# Patient Record
Sex: Female | Born: 1975 | Race: Asian | Hispanic: No | Marital: Married | Smoking: Never smoker
Health system: Southern US, Community
[De-identification: ages and names within clinical notes are randomized; demographics above are authoritative.]

## PROBLEM LIST (undated history)

## (undated) DIAGNOSIS — Z789 Other specified health status: Secondary | ICD-10-CM

## (undated) DIAGNOSIS — F32A Depression, unspecified: Secondary | ICD-10-CM

## (undated) DIAGNOSIS — K743 Primary biliary cirrhosis: Secondary | ICD-10-CM

## (undated) DIAGNOSIS — D696 Thrombocytopenia, unspecified: Secondary | ICD-10-CM

## (undated) DIAGNOSIS — IMO0001 Reserved for inherently not codable concepts without codable children: Secondary | ICD-10-CM

## (undated) DIAGNOSIS — R0600 Dyspnea, unspecified: Secondary | ICD-10-CM

## (undated) DIAGNOSIS — K627 Radiation proctitis: Secondary | ICD-10-CM

## (undated) DIAGNOSIS — F329 Major depressive disorder, single episode, unspecified: Secondary | ICD-10-CM

## (undated) DIAGNOSIS — K7469 Other cirrhosis of liver: Secondary | ICD-10-CM

## (undated) HISTORY — PX: TUBAL LIGATION: SHX77

## (undated) HISTORY — PX: DILATION AND CURETTAGE OF UTERUS: SHX78

---
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
## 1997-12-19 DIAGNOSIS — K831 Obstruction of bile duct: Secondary | ICD-10-CM

## 1997-12-19 HISTORY — DX: Obstruction of bile duct: K83.1

## 2018-12-19 DIAGNOSIS — C55 Malignant neoplasm of uterus, part unspecified: Secondary | ICD-10-CM

## 2018-12-19 HISTORY — DX: Malignant neoplasm of uterus, part unspecified: C55

## 2019-09-24 DIAGNOSIS — I959 Hypotension, unspecified: Secondary | ICD-10-CM

## 2019-09-24 DIAGNOSIS — D696 Thrombocytopenia, unspecified: Secondary | ICD-10-CM

## 2019-09-24 DIAGNOSIS — Z6821 Body mass index (BMI) 21.0-21.9, adult: Secondary | ICD-10-CM

## 2019-09-24 DIAGNOSIS — D509 Iron deficiency anemia, unspecified: Secondary | ICD-10-CM

## 2019-09-24 DIAGNOSIS — D61818 Other pancytopenia: Secondary | ICD-10-CM

## 2019-09-24 DIAGNOSIS — D649 Anemia, unspecified: Secondary | ICD-10-CM

## 2019-09-24 DIAGNOSIS — R10819 Abdominal tenderness, unspecified site: Secondary | ICD-10-CM

## 2019-09-24 DIAGNOSIS — K7460 Unspecified cirrhosis of liver: Secondary | ICD-10-CM

## 2019-09-24 DIAGNOSIS — D689 Coagulation defect, unspecified: Secondary | ICD-10-CM

## 2019-09-24 DIAGNOSIS — R188 Other ascites: Secondary | ICD-10-CM

## 2019-09-24 DIAGNOSIS — Z597 Insufficient social insurance and welfare support: Secondary | ICD-10-CM

## 2019-09-24 DIAGNOSIS — Z8719 Personal history of other diseases of the digestive system: Secondary | ICD-10-CM

## 2019-09-24 DIAGNOSIS — E46 Unspecified protein-calorie malnutrition: Secondary | ICD-10-CM

## 2019-09-24 DIAGNOSIS — R109 Unspecified abdominal pain: Secondary | ICD-10-CM

## 2019-09-24 DIAGNOSIS — K743 Primary biliary cirrhosis: Secondary | ICD-10-CM

## 2019-09-24 DIAGNOSIS — Z20828 Contact with and (suspected) exposure to other viral communicable diseases: Secondary | ICD-10-CM

## 2019-09-24 DIAGNOSIS — R140 Abdominal distension (gaseous): Secondary | ICD-10-CM

## 2019-09-24 DIAGNOSIS — K429 Umbilical hernia without obstruction or gangrene: Secondary | ICD-10-CM

## 2019-09-24 DIAGNOSIS — R197 Diarrhea, unspecified: Secondary | ICD-10-CM

## 2019-09-24 DIAGNOSIS — K7290 Hepatic failure, unspecified without coma: Secondary | ICD-10-CM

## 2019-09-25 DIAGNOSIS — D689 Coagulation defect, unspecified: Secondary | ICD-10-CM

## 2019-09-25 DIAGNOSIS — D696 Thrombocytopenia, unspecified: Secondary | ICD-10-CM

## 2019-09-25 DIAGNOSIS — R188 Other ascites: Secondary | ICD-10-CM

## 2019-09-25 DIAGNOSIS — I959 Hypotension, unspecified: Secondary | ICD-10-CM

## 2019-09-25 DIAGNOSIS — K7460 Unspecified cirrhosis of liver: Secondary | ICD-10-CM

## 2019-09-25 DIAGNOSIS — R140 Abdominal distension (gaseous): Secondary | ICD-10-CM

## 2019-09-25 DIAGNOSIS — Z597 Insufficient social insurance and welfare support: Secondary | ICD-10-CM

## 2019-09-25 DIAGNOSIS — D61818 Other pancytopenia: Secondary | ICD-10-CM

## 2019-09-25 DIAGNOSIS — Z6821 Body mass index (BMI) 21.0-21.9, adult: Secondary | ICD-10-CM

## 2019-09-25 DIAGNOSIS — E46 Unspecified protein-calorie malnutrition: Secondary | ICD-10-CM

## 2019-09-25 DIAGNOSIS — R197 Diarrhea, unspecified: Secondary | ICD-10-CM

## 2019-09-25 DIAGNOSIS — Z20828 Contact with and (suspected) exposure to other viral communicable diseases: Secondary | ICD-10-CM

## 2019-09-25 DIAGNOSIS — R10819 Abdominal tenderness, unspecified site: Secondary | ICD-10-CM

## 2019-09-25 DIAGNOSIS — K7290 Hepatic failure, unspecified without coma: Secondary | ICD-10-CM

## 2019-09-25 DIAGNOSIS — K429 Umbilical hernia without obstruction or gangrene: Secondary | ICD-10-CM

## 2019-09-25 DIAGNOSIS — D509 Iron deficiency anemia, unspecified: Secondary | ICD-10-CM

## 2019-09-26 DIAGNOSIS — Z20828 Contact with and (suspected) exposure to other viral communicable diseases: Secondary | ICD-10-CM

## 2019-09-26 DIAGNOSIS — D696 Thrombocytopenia, unspecified: Secondary | ICD-10-CM

## 2019-09-26 DIAGNOSIS — R197 Diarrhea, unspecified: Secondary | ICD-10-CM

## 2019-09-26 DIAGNOSIS — I959 Hypotension, unspecified: Secondary | ICD-10-CM

## 2019-09-26 DIAGNOSIS — D509 Iron deficiency anemia, unspecified: Secondary | ICD-10-CM

## 2019-09-26 DIAGNOSIS — R140 Abdominal distension (gaseous): Secondary | ICD-10-CM

## 2019-09-26 DIAGNOSIS — K7290 Hepatic failure, unspecified without coma: Secondary | ICD-10-CM

## 2019-09-26 DIAGNOSIS — R10819 Abdominal tenderness, unspecified site: Secondary | ICD-10-CM

## 2019-09-26 DIAGNOSIS — D61818 Other pancytopenia: Secondary | ICD-10-CM

## 2019-09-26 DIAGNOSIS — Z6821 Body mass index (BMI) 21.0-21.9, adult: Secondary | ICD-10-CM

## 2019-09-26 DIAGNOSIS — R188 Other ascites: Secondary | ICD-10-CM

## 2019-09-26 DIAGNOSIS — K7460 Unspecified cirrhosis of liver: Secondary | ICD-10-CM

## 2019-09-26 DIAGNOSIS — K429 Umbilical hernia without obstruction or gangrene: Secondary | ICD-10-CM

## 2019-09-26 DIAGNOSIS — Z597 Insufficient social insurance and welfare support: Secondary | ICD-10-CM

## 2019-09-26 DIAGNOSIS — D689 Coagulation defect, unspecified: Secondary | ICD-10-CM

## 2019-09-26 DIAGNOSIS — E46 Unspecified protein-calorie malnutrition: Secondary | ICD-10-CM

## 2019-09-28 MED ORDER — SPIRONOLACTONE 25 MG TABLET
ORAL_TABLET | Freq: Every day | ORAL | 0 refills | 30.00000 days | Status: CP
Start: 2019-09-28 — End: 2019-10-28

## 2019-09-28 MED ORDER — FERROUS SULFATE 325 MG (65 MG IRON) TABLET
ORAL_TABLET | Freq: Two times a day (BID) | ORAL | 3 refills | 90.00000 days | Status: CP
Start: 2019-09-28 — End: ?

## 2019-09-28 MED ORDER — PANTOPRAZOLE 40 MG TABLET,DELAYED RELEASE
ORAL_TABLET | Freq: Every day | ORAL | 0 refills | 90.00000 days | Status: CP
Start: 2019-09-28 — End: ?

## 2019-09-28 MED ORDER — CIPROFLOXACIN 500 MG TABLET
ORAL_TABLET | ORAL | 0 refills | 29.00000 days | Status: CP
Start: 2019-09-28 — End: ?

## 2019-09-28 MED ORDER — ONDANSETRON 4 MG DISINTEGRATING TABLET
ORAL_TABLET | Freq: Three times a day (TID) | ORAL | 0 refills | 4.00000 days | Status: CP | PRN
Start: 2019-09-28 — End: 2019-10-05

## 2019-09-28 MED ORDER — FUROSEMIDE 40 MG TABLET
ORAL_TABLET | Freq: Every day | ORAL | 3 refills | 90.00000 days | Status: CP
Start: 2019-09-28 — End: 2019-10-28

## 2019-09-28 MED ORDER — MIDODRINE 5 MG TABLET
ORAL_TABLET | Freq: Three times a day (TID) | ORAL | 11 refills | 30.00000 days | Status: CP
Start: 2019-09-28 — End: ?

## 2019-09-28 MED ORDER — URSODIOL 300 MG CAPSULE
ORAL_CAPSULE | Freq: Two times a day (BID) | ORAL | 11 refills | 30.00000 days | Status: CP
Start: 2019-09-28 — End: 2019-10-28

## 2019-09-28 MED FILL — URSODIOL 300 MG CAPSULE: 30 days supply | Qty: 60 | Fill #0 | Status: AC

## 2019-09-28 MED FILL — SPIRONOLACTONE 25 MG TABLET: 30 days supply | Qty: 60 | Fill #0 | Status: AC

## 2019-09-28 MED FILL — CIPROFLOXACIN 500 MG TABLET: 29 days supply | Qty: 29 | Fill #0 | Status: AC

## 2019-09-28 MED FILL — ONDANSETRON 4 MG DISINTEGRATING TABLET: 4 days supply | Qty: 10 | Fill #0 | Status: AC

## 2019-09-28 MED FILL — MIDODRINE 5 MG TABLET: 30 days supply | Qty: 270 | Fill #0 | Status: AC

## 2019-09-28 MED FILL — PANTOPRAZOLE 40 MG TABLET,DELAYED RELEASE: 30 days supply | Qty: 30 | Fill #0 | Status: AC

## 2019-09-28 MED FILL — FUROSEMIDE 40 MG TABLET: 30 days supply | Qty: 30 | Fill #0 | Status: AC

## 2019-09-28 MED FILL — FERROUS SULFATE 325 MG (65 MG IRON) TABLET: 30 days supply | Qty: 60 | Fill #0 | Status: AC

## 2019-09-29 DIAGNOSIS — R4182 Altered mental status, unspecified: Secondary | ICD-10-CM

## 2019-09-29 DIAGNOSIS — K7290 Hepatic failure, unspecified without coma: Secondary | ICD-10-CM

## 2019-10-01 MED ORDER — LACTULOSE 10 GRAM/15 ML ORAL SOLUTION: 20 g | mL | Freq: Two times a day (BID) | 0 refills | 32 days | Status: AC

## 2020-01-09 ENCOUNTER — Emergency Department (HOSPITAL_COMMUNITY): Payer: Medicaid Other

## 2020-01-09 ENCOUNTER — Inpatient Hospital Stay (HOSPITAL_COMMUNITY)
Admission: EM | Admit: 2020-01-09 | Discharge: 2020-01-14 | DRG: 441 | Discharge status: Against Medical Advice | Payer: Medicaid Other | Attending: Internal Medicine | Admitting: Internal Medicine

## 2020-01-09 ENCOUNTER — Inpatient Hospital Stay (HOSPITAL_COMMUNITY): Payer: Medicaid Other

## 2020-01-09 ENCOUNTER — Other Ambulatory Visit: Payer: Self-pay

## 2020-01-09 ENCOUNTER — Encounter (HOSPITAL_COMMUNITY): Payer: Self-pay | Admitting: Emergency Medicine

## 2020-01-09 DIAGNOSIS — D638 Anemia in other chronic diseases classified elsewhere: Secondary | ICD-10-CM | POA: Diagnosis present

## 2020-01-09 DIAGNOSIS — Z5329 Procedure and treatment not carried out because of patient's decision for other reasons: Secondary | ICD-10-CM | POA: Diagnosis present

## 2020-01-09 DIAGNOSIS — D731 Hypersplenism: Secondary | ICD-10-CM | POA: Diagnosis present

## 2020-01-09 DIAGNOSIS — K743 Primary biliary cirrhosis: Secondary | ICD-10-CM | POA: Diagnosis present

## 2020-01-09 DIAGNOSIS — Z515 Encounter for palliative care: Secondary | ICD-10-CM

## 2020-01-09 DIAGNOSIS — Z923 Personal history of irradiation: Secondary | ICD-10-CM

## 2020-01-09 DIAGNOSIS — R64 Cachexia: Secondary | ICD-10-CM | POA: Diagnosis present

## 2020-01-09 DIAGNOSIS — Z681 Body mass index (BMI) 19 or less, adult: Secondary | ICD-10-CM

## 2020-01-09 DIAGNOSIS — Z7682 Awaiting organ transplant status: Secondary | ICD-10-CM

## 2020-01-09 DIAGNOSIS — J90 Pleural effusion, not elsewhere classified: Secondary | ICD-10-CM

## 2020-01-09 DIAGNOSIS — K766 Portal hypertension: Secondary | ICD-10-CM | POA: Diagnosis present

## 2020-01-09 DIAGNOSIS — J9601 Acute respiratory failure with hypoxia: Secondary | ICD-10-CM | POA: Diagnosis present

## 2020-01-09 DIAGNOSIS — Z7189 Other specified counseling: Secondary | ICD-10-CM

## 2020-01-09 DIAGNOSIS — Z20822 Contact with and (suspected) exposure to covid-19: Secondary | ICD-10-CM | POA: Diagnosis present

## 2020-01-09 DIAGNOSIS — R188 Other ascites: Secondary | ICD-10-CM

## 2020-01-09 DIAGNOSIS — E209 Hypoparathyroidism, unspecified: Secondary | ICD-10-CM | POA: Diagnosis present

## 2020-01-09 DIAGNOSIS — E43 Unspecified severe protein-calorie malnutrition: Secondary | ICD-10-CM | POA: Diagnosis present

## 2020-01-09 DIAGNOSIS — K7290 Hepatic failure, unspecified without coma: Secondary | ICD-10-CM | POA: Diagnosis present

## 2020-01-09 DIAGNOSIS — K7460 Unspecified cirrhosis of liver: Secondary | ICD-10-CM

## 2020-01-09 DIAGNOSIS — R0602 Shortness of breath: Secondary | ICD-10-CM | POA: Diagnosis present

## 2020-01-09 DIAGNOSIS — Z833 Family history of diabetes mellitus: Secondary | ICD-10-CM | POA: Diagnosis not present

## 2020-01-09 DIAGNOSIS — J948 Other specified pleural conditions: Secondary | ICD-10-CM

## 2020-01-09 DIAGNOSIS — Z9889 Other specified postprocedural states: Secondary | ICD-10-CM

## 2020-01-09 DIAGNOSIS — Z8709 Personal history of other diseases of the respiratory system: Secondary | ICD-10-CM

## 2020-01-09 DIAGNOSIS — F329 Major depressive disorder, single episode, unspecified: Secondary | ICD-10-CM | POA: Insufficient documentation

## 2020-01-09 DIAGNOSIS — F32A Depression, unspecified: Secondary | ICD-10-CM | POA: Insufficient documentation

## 2020-01-09 DIAGNOSIS — R7989 Other specified abnormal findings of blood chemistry: Secondary | ICD-10-CM

## 2020-01-09 DIAGNOSIS — Z885 Allergy status to narcotic agent status: Secondary | ICD-10-CM | POA: Diagnosis not present

## 2020-01-09 DIAGNOSIS — J918 Pleural effusion in other conditions classified elsewhere: Secondary | ICD-10-CM

## 2020-01-09 DIAGNOSIS — K7469 Other cirrhosis of liver: Secondary | ICD-10-CM

## 2020-01-09 DIAGNOSIS — Z531 Procedure and treatment not carried out because of patient's decision for reasons of belief and group pressure: Secondary | ICD-10-CM | POA: Diagnosis present

## 2020-01-09 DIAGNOSIS — K7210 Chronic hepatic failure without coma: Secondary | ICD-10-CM

## 2020-01-09 DIAGNOSIS — D696 Thrombocytopenia, unspecified: Secondary | ICD-10-CM | POA: Diagnosis present

## 2020-01-09 DIAGNOSIS — R0600 Dyspnea, unspecified: Secondary | ICD-10-CM

## 2020-01-09 DIAGNOSIS — Z8542 Personal history of malignant neoplasm of other parts of uterus: Secondary | ICD-10-CM

## 2020-01-09 DIAGNOSIS — R0902 Hypoxemia: Secondary | ICD-10-CM

## 2020-01-09 HISTORY — DX: Dyspnea, unspecified: R06.00

## 2020-01-09 HISTORY — DX: Other specified health status: Z78.9

## 2020-01-09 HISTORY — DX: Radiation proctitis: K62.7

## 2020-01-09 HISTORY — DX: Primary biliary cirrhosis: K74.3

## 2020-01-09 HISTORY — DX: Depression, unspecified: F32.A

## 2020-01-09 HISTORY — DX: Other cirrhosis of liver: K74.69

## 2020-01-09 HISTORY — DX: Thrombocytopenia, unspecified: D69.6

## 2020-01-09 HISTORY — PX: THORACENTESIS: SHX235

## 2020-01-09 HISTORY — DX: Reserved for inherently not codable concepts without codable children: IMO0001

## 2020-01-09 HISTORY — PX: PARACENTESIS: SHX844

## 2020-01-09 LAB — BODY FLUID CELL COUNT WITH DIFFERENTIAL
Eos, Fluid: 0 %
Eos, Fluid: 17 %
Lymphs, Fluid: 56 %
Lymphs, Fluid: 79 %
Monocyte-Macrophage-Serous Fluid: 11 % — ABNORMAL LOW (ref 50–90)
Monocyte-Macrophage-Serous Fluid: 20 % — ABNORMAL LOW (ref 50–90)
Neutrophil Count, Fluid: 10 % (ref 0–25)
Neutrophil Count, Fluid: 7 % (ref 0–25)
Other Cells, Fluid: 0 %
Other Cells, Fluid: 0 %
Total Nucleated Cell Count, Fluid: 26 cu mm (ref 0–1000)
Total Nucleated Cell Count, Fluid: 80 cu mm (ref 0–1000)

## 2020-01-09 LAB — CBC WITH DIFFERENTIAL/PLATELET
Abs Immature Granulocytes: 0.03 10*3/uL (ref 0.00–0.07)
Basophils Absolute: 0.1 10*3/uL (ref 0.0–0.1)
Basophils Relative: 1 %
Eosinophils Absolute: 0.5 10*3/uL (ref 0.0–0.5)
Eosinophils Relative: 8 %
HCT: 34.4 % — ABNORMAL LOW (ref 36.0–46.0)
Hemoglobin: 11 g/dL — ABNORMAL LOW (ref 12.0–15.0)
Immature Granulocytes: 1 %
Lymphocytes Relative: 10 %
Lymphs Abs: 0.5 10*3/uL — ABNORMAL LOW (ref 0.7–4.0)
MCH: 35.3 pg — ABNORMAL HIGH (ref 26.0–34.0)
MCHC: 32 g/dL (ref 30.0–36.0)
MCV: 110.3 fL — ABNORMAL HIGH (ref 80.0–100.0)
Monocytes Absolute: 0.5 10*3/uL (ref 0.1–1.0)
Monocytes Relative: 9 %
Neutro Abs: 3.8 10*3/uL (ref 1.7–7.7)
Neutrophils Relative %: 71 %
Platelets: 37 10*3/uL — ABNORMAL LOW (ref 150–400)
RBC: 3.12 MIL/uL — ABNORMAL LOW (ref 3.87–5.11)
RDW: 16.1 % — ABNORMAL HIGH (ref 11.5–15.5)
WBC: 5.3 10*3/uL (ref 4.0–10.5)
nRBC: 0 % (ref 0.0–0.2)

## 2020-01-09 LAB — GLUCOSE, PLEURAL OR PERITONEAL FLUID
Glucose, Fluid: 84 mg/dL
Glucose, Fluid: 84 mg/dL

## 2020-01-09 LAB — COMPREHENSIVE METABOLIC PANEL
ALT: 52 U/L — ABNORMAL HIGH (ref 0–44)
AST: 118 U/L — ABNORMAL HIGH (ref 15–41)
Albumin: 2.8 g/dL — ABNORMAL LOW (ref 3.5–5.0)
Alkaline Phosphatase: 225 U/L — ABNORMAL HIGH (ref 38–126)
Anion gap: 11 (ref 5–15)
BUN: 16 mg/dL (ref 6–20)
CO2: 20 mmol/L — ABNORMAL LOW (ref 22–32)
Calcium: 9 mg/dL (ref 8.9–10.3)
Chloride: 105 mmol/L (ref 98–111)
Creatinine, Ser: 0.99 mg/dL (ref 0.44–1.00)
GFR calc Af Amer: >60 mL/min (ref >60)
GFR calc non Af Amer: >60 mL/min (ref >60)
Glucose, Bld: 73 mg/dL (ref 70–99)
Potassium: 5.1 mmol/L (ref 3.5–5.1)
Sodium: 136 mmol/L (ref 135–145)
Total Bilirubin: 9.8 mg/dL — ABNORMAL HIGH (ref 0.3–1.2)
Total Protein: 5.4 g/dL — ABNORMAL LOW (ref 6.5–8.1)

## 2020-01-09 LAB — PROTEIN, PLEURAL OR PERITONEAL FLUID
Total protein, fluid: <3 g/dL
Total protein, fluid: <3 g/dL

## 2020-01-09 LAB — LACTATE DEHYDROGENASE, PLEURAL OR PERITONEAL FLUID: LD, Fluid: 31 U/L — ABNORMAL HIGH (ref 3–23)

## 2020-01-09 LAB — PROTIME-INR
INR: 1.3 — ABNORMAL HIGH (ref 0.8–1.2)
Prothrombin Time: 16 seconds — ABNORMAL HIGH (ref 11.4–15.2)

## 2020-01-09 LAB — ABO/RH: ABO/RH(D): O POS

## 2020-01-09 LAB — RESPIRATORY PANEL BY RT PCR (FLU A&B, COVID)
Influenza A by PCR: NEGATIVE
Influenza B by PCR: NEGATIVE
SARS Coronavirus 2 by RT PCR: NEGATIVE

## 2020-01-09 LAB — ALBUMIN, PLEURAL OR PERITONEAL FLUID: Albumin, Fluid: <1 g/dL

## 2020-01-09 LAB — TYPE AND SCREEN
ABO/RH(D): O POS
Antibody Screen: NEGATIVE

## 2020-01-09 MED ORDER — URSODIOL 300 MG PO CAPS
300.0000 mg | ORAL_CAPSULE | Freq: Two times a day (BID) | ORAL | Status: DC
  Administered 2020-01-09 – 2020-01-14 (×10): 300 mg via ORAL
  Filled 2020-01-09 (×12): qty 1

## 2020-01-09 MED ORDER — LACTULOSE 10 GM/15ML PO SOLN
20.0000 g | Freq: Three times a day (TID) | ORAL | Status: DC
  Administered 2020-01-09 – 2020-01-14 (×8): 20 g via ORAL
  Filled 2020-01-09 (×11): qty 30

## 2020-01-09 MED ORDER — SPIRONOLACTONE 25 MG PO TABS
100.0000 mg | ORAL_TABLET | Freq: Every day | ORAL | Status: DC
  Administered 2020-01-10: 100 mg via ORAL
  Filled 2020-01-09: qty 1
  Filled 2020-01-09: qty 4

## 2020-01-09 MED ORDER — PANTOPRAZOLE SODIUM 40 MG PO TBEC
40.0000 mg | DELAYED_RELEASE_TABLET | Freq: Every day | ORAL | Status: DC
  Administered 2020-01-09 – 2020-01-14 (×6): 40 mg via ORAL
  Filled 2020-01-09 (×6): qty 1

## 2020-01-09 MED ORDER — ALBUMIN HUMAN 25 % IV SOLN
25.0000 g | Freq: Once | INTRAVENOUS | Status: AC
  Administered 2020-01-09: 25 g via INTRAVENOUS
  Filled 2020-01-09 (×2): qty 100

## 2020-01-09 MED ORDER — SODIUM CHLORIDE 0.9% FLUSH
3.0000 mL | Freq: Two times a day (BID) | INTRAVENOUS | Status: DC
  Administered 2020-01-09: 10 mL via INTRAVENOUS
  Administered 2020-01-09 – 2020-01-14 (×10): 3 mL via INTRAVENOUS

## 2020-01-09 MED ORDER — MIDODRINE HCL 5 MG PO TABS
15.0000 mg | ORAL_TABLET | Freq: Three times a day (TID) | ORAL | Status: DC
  Administered 2020-01-09 – 2020-01-14 (×14): 15 mg via ORAL
  Filled 2020-01-09 (×15): qty 3

## 2020-01-09 MED ORDER — SULFAMETHOXAZOLE-TRIMETHOPRIM 800-160 MG PO TABS
1.0000 | ORAL_TABLET | Freq: Every day | ORAL | Status: DC
  Administered 2020-01-09 – 2020-01-14 (×6): 1 via ORAL
  Filled 2020-01-09 (×6): qty 1

## 2020-01-09 MED ORDER — ONDANSETRON HCL 4 MG PO TABS: 4.0000 mg | ORAL_TABLET | Freq: Four times a day (QID) | ORAL | Status: DC | PRN

## 2020-01-09 MED ORDER — FUROSEMIDE 40 MG PO TABS
40.0000 mg | ORAL_TABLET | Freq: Every day | ORAL | Status: DC
  Administered 2020-01-09 – 2020-01-10 (×2): 40 mg via ORAL
  Filled 2020-01-09: qty 1
  Filled 2020-01-09: qty 2

## 2020-01-09 MED ORDER — SODIUM CHLORIDE 0.9% IV SOLUTION: Freq: Once | INTRAVENOUS | Status: DC

## 2020-01-09 MED ORDER — ONDANSETRON HCL 4 MG/2ML IJ SOLN
4.0000 mg | Freq: Four times a day (QID) | INTRAMUSCULAR | Status: DC | PRN
  Administered 2020-01-10 – 2020-01-13 (×3): 4 mg via INTRAVENOUS
  Filled 2020-01-09 (×4): qty 2

## 2020-01-09 MED ORDER — FENTANYL CITRATE (PF) 100 MCG/2ML IJ SOLN: 40.0000 ug | INTRAMUSCULAR | Status: DC | PRN

## 2020-01-09 MED ORDER — CINACALCET HCL 30 MG PO TABS
30.0000 mg | ORAL_TABLET | ORAL | Status: DC
  Administered 2020-01-10 – 2020-01-13 (×2): 30 mg via ORAL
  Filled 2020-01-09 (×2): qty 1

## 2020-01-09 NOTE — ED Provider Notes (Signed)
[facility] EMERGENCY DEPARTMENT Provider Note   CSN: [id number] Arrival date & time: 01/09/20  [id number]     History Chief Complaint  Patient presents with  . Abdominal Pain  . Shortness of Breath    Hannah Donovan is a 44 y.o. female.  Patient presents with worsening dyspnea and abdominal distention for the past 2 days.  Patient has had thoracentesis and paracentesis in the past.  Patient feels pressure in the left lower lung and upper abdomen.  No abdominal pain or fevers.  No chills.  No Covid contacts.  Patient feels fatigued.  Patient is trying to get insurance so she can get on the liver transplant list.  Patient's liver disease started since her pregnancy over 20 years ago.  Patient denies vomiting.  Patient intermittently has small amount of blood in her stool thus normal from proctitis that she had radiation for cancer in the past.  No significant bleeding.        Past Medical History:  Diagnosis Date  . Cholestasis of pregnancy 1999  . 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 Depression   . Primary biliary cirrhosis ([place])   . Radiation proctitis   . Thrombocytopenia ([place])   . Uterine cancer ([place]) 2020   treated with radiation therapy in [place]    Patient Active Problem List   Diagnosis Date Noted  . Liver failure without hepatic coma ([place]) 01/09/2020  . Depression   . 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 DILATION AND CURETTAGE OF UTERUS    . TUBAL LIGATION       OB History   No obstetric history on file.     Family History  Problem Relation Age of Onset  . Diabetes Father   . Liver disease Neg Hx     Social History   Tobacco Use  . Smoking status: Never Smoker  . Smokeless tobacco: Never Used  Substance Use Topics  . Alcohol use: Never  . Drug use: Never    Home Medications Prior to Admission medications   Medication Sig Start Date End Date Taking?  Authorizing Provider  acetaminophen (TYLENOL) 500 MG tablet Take 1,000 mg by mouth every 12 (twelve) hours as needed for mild pain.   Yes [provider]  cholecalciferol (VITAMIN D3) 25 MCG (1000 UNIT) tablet Take 5,000 Units by mouth daily.   Yes [provider]  cinacalcet (SENSIPAR) 30 MG tablet Take 30 mg by mouth every Monday, Wednesday, and Friday.   Yes [provider]  ferrous sulfate 325 (65 FE) MG tablet Take 325 mg by mouth 2 (two) times daily.   Yes [provider]  furosemide (LASIX) 40 MG tablet Take 40 mg by mouth daily.   Yes [provider]  lactulose (CHRONULAC) 10 GM/15ML solution Take 20 g by mouth 3 (three) times daily.   Yes [provider]  midodrine (PROAMATINE) 5 MG tablet Take 15 mg by mouth 3 (three) times daily with meals.   Yes [provider]  pantoprazole (PROTONIX) 40 MG tablet Take 40 mg by mouth daily.   Yes [provider]  spironolactone (ALDACTONE) 100 MG tablet Take 100 mg by mouth daily.   Yes [provider]  sulfamethoxazole-trimethoprim (BACTRIM DS) 800-160 MG tablet Take 1 tablet by mouth daily.   Yes [provider]  ursodiol (ACTIGALL) 300 MG capsule Take 300 mg by mouth 2 (two) times daily.  Yes [provider]    Allergies    Morphine and related  Review of Systems   Review of Systems  Constitutional: Positive for fatigue. Negative for chills and fever.  HENT: Negative for congestion.   Eyes: Negative for visual disturbance.  Respiratory: Positive for cough and shortness of breath.   Cardiovascular: Negative for chest pain and leg swelling.  Gastrointestinal: Positive for abdominal distention. Negative for abdominal pain and vomiting.  Genitourinary: Negative for dysuria and flank pain.  Musculoskeletal: Negative for back pain, neck pain and neck stiffness.  Skin: Negative for rash.  Neurological: Negative for light-headedness and headaches.      Physical Exam Updated Vital Signs BP 118/74   Pulse 100   Temp 98.3 F (36.8 C) (Oral)   Resp 19   SpO2 95%   Physical Exam Vitals and nursing note reviewed.  Constitutional:      Appearance: She is well-developed.  HENT:     Head: Normocephalic and atraumatic.  Eyes:     General: Scleral icterus present.        Right eye: No discharge.        Left eye: No discharge.  Neck:     Trachea: No tracheal deviation.  Cardiovascular:     Rate and Rhythm: Normal rate and regular rhythm.  Pulmonary:     Comments: Patient has tachypnea, worsening breathing with lying flat.  Patient has decreased breath sounds left middle and lower, decreased breath sounds right lower.  Few sparse crackles. Abdominal:     General: There is no distension.     Palpations: Abdomen is soft. There is fluid wave.     Tenderness: There is no abdominal tenderness. There is no guarding.     Hernia: A hernia is present. Hernia is present in the umbilical area.  Musculoskeletal:     Cervical back: Normal range of motion and neck supple.  Skin:    General: Skin is warm.     Findings: No rash.  Neurological:     Mental Status: She is alert and oriented to person, place, and time.     ED Results / Procedures / Treatments   Labs (all labs ordered are listed, but only abnormal results are displayed) Labs Reviewed  COMPREHENSIVE METABOLIC PANEL - Abnormal; Notable for the following components:      Result Value   CO2 20 (*)    Total Protein 5.4 (*)    Albumin 2.8 (*)    AST 118 (*)    ALT 52 (*)    Alkaline Phosphatase 225 (*)    Total Bilirubin 9.8 (*)    All other components within normal limits  CBC WITH DIFFERENTIAL/PLATELET - Abnormal; Notable for the following components:   RBC 3.12 (*)    Hemoglobin 11.0 (*)    HCT 34.4 (*)    MCV 110.3 (*)    MCH 35.3 (*)    RDW 16.1 (*)    Platelets 37 (*)    Lymphs Abs 0.5 (*)    All other components within normal limits  PROTIME-INR - Abnormal;  Notable for the following components:   Prothrombin Time 16.0 (*)    INR 1.3 (*)    All other components within normal limits  RESPIRATORY PANEL BY RT PCR (FLU A&B, COVID)    EKG EKG Interpretation  Date/Time:  Thursday January 09 2020 08:46:36 EST Ventricular Rate:  99 PR Interval:    QRS Duration: 56 QT Interval:  356 QTC Calculation: 457 R  Axis:   42 Text Interpretation: Sinus rhythm Low voltage, precordial leads Borderline T abnormalities, anterior leads Confirmed by Elnora Morrison [phone]) on 01/09/2020 8:56:23 AM   Radiology DG Chest Portable 1 View  Result Date: 01/09/2020 CLINICAL DATA:  Shortness of breath EXAM: PORTABLE CHEST 1 VIEW COMPARISON:  None. FINDINGS: Heart size is poorly evaluated but appears within normal limits. Lung volumes are low. Moderate bilateral pleural effusions with associated bibasilar opacities. No pneumothorax. Osseous structures appear intact. IMPRESSION: Moderate bilateral pleural effusions with associated bibasilar opacities, which may reflect a combination of atelectasis and pneumonia. Electronically Signed   By: Davina Poke D.O.   On: 01/09/2020 10:18    Procedures Ultrasound ED Echo  Date/Time: 01/09/2020 10:12 AM Performed by: Elnora Morrison, MD Authorized by: Elnora Morrison, MD   Procedure details:    Indications: dyspnea     Views: subxiphoid and apical 4 chamber view     Images: archived     Limitations:  Body habitus Findings:    Pericardium: no pericardial effusion     LV Function: normal (>50% EF)   Ultrasound ED FAST  Date/Time: 01/09/2020 10:13 AM Performed by: Elnora Morrison, MD Authorized by: Elnora Morrison, MD  Procedure details:    Assess for:  Intra-abdominal fluid    Technique:  Abdominal    Images: archived      Abdominal findings:    L kidney:  Visualized   R kidney:  Visualized   Liver:  Visualized    Hepatorenal space visualized: identified     Splenorenal space: identified     Splenorenal free  fluid: identified     Hepatorenal space free fluid: identified   Ultrasound ED Thoracic  Date/Time: 01/09/2020 10:13 AM Performed by: Elnora Morrison, MD Authorized by: Elnora Morrison, MD   Procedure details:    Indications: dyspnea     Assessment for:  Pleural effusion   Left lung pleural:  Visualized   Right lung pleural:  Visualized   Images: archived   Right Lung Findings:     right lung pleural effusion      Impression:    Impression: fluid in thorax   Comments:     Large left, mild right   (including critical care time)  Medications Ordered in ED Medications  fentaNYL (SUBLIMAZE) injection 40 mcg (has no administration in time range)    ED Course  I have reviewed the triage vital signs and the nursing notes.  Pertinent labs & imaging results that were available during my care of the patient were reviewed by me and considered in my medical decision making (see chart for details).    MDM Rules/Calculators/A&P                      Patient presents with worsening dyspnea and unfortunate worsening cirrhosis and secondary effects to that.  EKG reviewed low voltage.  Bedside ultrasound performed no significant pericardial effusion however patient has large left pleural effusion, mild right pleural effusion and peritoneal fluid.  Patient has no abdominal tenderness or fevers I do not suspect SBP at this time.    Clinical concern for significant left pleural effusion and mild fluid in the abdomen.  Blood work reviewed overall chronic with liver disease with liver function tests mild elevated, mild anemia 11.2.  INR mildly elevated 1.3.  Patient will need thoracentesis and likely paracentesis.  Covid test ordered.  Chest x-ray pending.  Chest x-ray consistent with ultrasound showing fluid bilateral.  Discussed with hospitalist  for admission/observation for thoracentesis and possible further evaluation/work-up.  Patient of note has been multiple different hospital systems in the  past for her care.  France Powe was evaluated in Emergency Department on 01/09/2020 for the symptoms described in the history of present illness. She was evaluated in the context of the global COVID-19 pandemic, which necessitated consideration that the patient might be at risk for infection with the SARS-CoV-2 virus that causes COVID-19. Institutional protocols and algorithms that pertain to the evaluation of patients at risk for COVID-19 are in a state of rapid change based on information released by regulatory bodies including the CDC and federal and state organizations. These policies and algorithms were followed during the patient's care in the ED.  Final Clinical Impression(s) / ED Diagnoses Final diagnoses:  Cirrhosis of liver with ascites, unspecified hepatic cirrhosis type ([place])  Recurrent pleural effusion on left  Acute dyspnea  LFT elevation    Rx / DC Orders ED Discharge Orders    None       Elnora Morrison, MD 01/09/20 1128

## 2020-01-09 NOTE — Progress Notes (Signed)
NEW ADMISSION NOTE New Admission Note:   Arrival Method: From ED by stretcher  Mental Orientation: alert and oriented  Telemetry: box: 16 Assessment: Completed Skin: abdominal distention IV: LH and RAC Pain: 3 abdomen discomfort  Safety Measures: Safety Fall Prevention Plan has been given, discussed and signed Admission: Completed [facility] Orientation: Patient has been orientated to the room, unit and staff.  Family: NA  Orders have been reviewed and implemented. Will continue to monitor the patient. Call light has been placed within reach and bed alarm has been activated.   Baldo Ash, RN

## 2020-01-09 NOTE — Significant Event (Signed)
Rapid Response Event Note  Bedside Procedure - Paracentesis   I was present for procedure, patient was on continuous telemetry and pulse ox monitoring, normal BPs, finishing a bag of PLTS. Informed consent was obtained, Time out was done, and patient tolerated the procedure well, 1L of fluid was removed.   I was called to an emergency, I called the SWOT RN to take over for me. Patient was also going to have a thoracentesis   Start Time 1430 End Time 1534   [name]

## 2020-01-09 NOTE — Procedures (Signed)
Paracentesis Procedure Note  Indications: Ascites in the setting of cirrhosis   Procedure Details  Consent: Informed consent was obtained. Risks of the procedure were discussed including: infection, bleeding, pain, bowel perforation.  Maximum sterile technique was used including antiseptics, cap, gloves, hand hygiene, mask and sheet. Skin prep: Chlorhexidine x3; local anesthetic administered. The abdominal wall was punctured in the right lower quadrant . Fluid was obtained without any difficulties and minimal blood loss.  A dressing was applied to the wound and wound care instructions were provided.   Findings 1L of cloudy ascitic fluid was obtained. A sample was sent to Pathology for cytology and cell counts, as well as for infection analysis.  Complications:  None; patient tolerated the procedure well.        Condition: stable  Johnsie Cancel, NP-C [facility] Contact / Pager information can be found on Amion  01/09/2020, 4:35 PM

## 2020-01-09 NOTE — Procedures (Signed)
Thoracentesis Procedure Note  Pre-operative Diagnosis: Hepatic hydrothorax   Post-operative Diagnosis: same  Indications: Respiratory insufficiencies in the setting of hepatic hydrothorax   Procedure Details  Consent: Informed consent was obtained. Risks of the procedure were discussed including: infection, bleeding, pain, pneumothorax.  Under sterile conditions the patient was positioned. Betadine solution and sterile drapes were utilized. 1% buffered lidocaine was used to anesthetize the left chest wall at the appropriate rib space to obtain adequate fluid removal per ultrasound. Fluid was obtained without any difficulties and minimal blood loss.  A dressing was applied to the wound and wound care instructions were provided.   Findings 1.5L of cloudy pleural fluid was obtained. A sample was sent to Pathology for cytogenetics, flow, and cell counts, as well as for infection analysis.  Complications:  None; patient tolerated the procedure well.          Condition: stable  Plan A follow up chest x-ray was ordered. Bed Rest for 1 hours. Tylenol 650 mg. for pain.  Johnsie Cancel, NP-C [facility] Contact / Pager information can be found on Amion  01/09/2020, 4:38 PM

## 2020-01-09 NOTE — H&P (Addendum)
History and Physical    Hannah Donovan [id number] DOB: Aug 28, 1976 DOA: 01/09/2020  PCP: Patient, No Pcp Per Consultants:  [facility] for now Patient coming from:  Home - lives with uncle in [place]; her son came and they are moving to a mobile home in [place]; [place]:  Hannah Donovan, [phone]  Chief Complaint: Abdominal pain, SOB  HPI: Hannah Donovan is a 44 y.o. female with medical history significant of cryptogenic cirrhosis with ascites (last paracentesis was 1/13 at [facility]) and hepatic encephalopathy; L hydrothorax; and depression presenting with abdominal pain and SOB.  She lives in [place], but has been going to [facility] for her care.  She can tell when she gets fluid build up in her lung and was told to come tot he nearest hospital with worsening SOB.  She has been having pain in her back and SOB, especially with leaning forward.  O2 sats were ok.  Last night, she tried to lie down, couldn't lie flat, O2 sats down to 88, too SOB.  This AM, her cousin called 71.  No abdominal pain.  She is not certain whether she will continue care here or there - she will talk to her family because it is such a long way.  She is undocumented, does not have insurance, and wants to get on the waiting list for a transplant.   ED Course:  Large L pleural effusion, also with ascites.  Needs drainage and GI care.  She is trying to get insurance to get on the transplant list.   Review of Systems: As per HPI; otherwise review of systems reviewed and negative.   Ambulatory Status:  Ambulates without assistance  Past Medical History:  Diagnosis Date  . Cholestasis of pregnancy 1999  . Cirrhosis, cryptogenic ([place])    with ascites, encephalopathy  . Depression   . Primary biliary cirrhosis ([place])   . Radiation proctitis   . Thrombocytopenia ([place])   . Uterine cancer ([place]) 2020   treated with radiation therapy in [place]    Past Surgical History:  Procedure Laterality Date  . DILATION AND  CURETTAGE OF UTERUS    . TUBAL LIGATION      Social History   Socioeconomic History  . Marital status: Married    Spouse name: Not on file  . Number of children: Not on file  . Years of education: Not on file  . Highest education level: Not on file  Occupational History  . Occupation: unemployed  Tobacco Use  . Smoking status: Never Smoker  . Smokeless tobacco: Never Used  Substance and Sexual Activity  . Alcohol use: Never  . Drug use: Never  . Sexual activity: Not on file  Other Topics Concern  . Not on file  Social History Narrative  . Not on file   Social Determinants of Health   Financial Resource Strain:   . Difficulty of Paying Living Expenses: Not on file  Food Insecurity:   . Worried About Charity fundraiser in the Last Year: Not on file  . Ran Out of Food in the Last Year: Not on file  Transportation Needs:   . Lack of Transportation (Medical): Not on file  . Lack of Transportation (Non-Medical): Not on file  Physical Activity:   . Days of Exercise per Week: Not on file  . Minutes of Exercise per Session: Not on file  Stress:   . Feeling of Stress : Not on file  Social Connections:   . Frequency of Communication  with Friends and Family: Not on file  . Frequency of Social Gatherings with Friends and Family: Not on file  . Attends Religious Services: Not on file  . Active Member of Clubs or Organizations: Not on file  . Attends Archivist Meetings: Not on file  . Marital Status: Not on file  Intimate Partner Violence:   . Fear of Current or Ex-Partner: Not on file  . Emotionally Abused: Not on file  . Physically Abused: Not on file  . Sexually Abused: Not on file    Allergies  Allergen Reactions  . Morphine And Related Other (See Comments)    BP drops    Family History  Problem Relation Age of Onset  . Diabetes Father   . Liver disease Neg Hx     Prior to Admission medications   Medication Sig Start Date End Date Taking?  Authorizing Provider  acetaminophen (TYLENOL) 500 MG tablet Take 1,000 mg by mouth every 12 (twelve) hours as needed for mild pain.   Yes [provider]  cholecalciferol (VITAMIN D3) 25 MCG (1000 UNIT) tablet Take 5,000 Units by mouth daily.   Yes [provider]  cinacalcet (SENSIPAR) 30 MG tablet Take 30 mg by mouth every Monday, Wednesday, and Friday.   Yes [provider]  ferrous sulfate 325 (65 FE) MG tablet Take 325 mg by mouth 2 (two) times daily.   Yes [provider]  furosemide (LASIX) 40 MG tablet Take 40 mg by mouth daily.   Yes [provider]  lactulose (CHRONULAC) 10 GM/15ML solution Take 20 g by mouth 3 (three) times daily.   Yes [provider]  midodrine (PROAMATINE) 5 MG tablet Take 15 mg by mouth 3 (three) times daily with meals.   Yes [provider]  pantoprazole (PROTONIX) 40 MG tablet Take 40 mg by mouth daily.   Yes [provider]  spironolactone (ALDACTONE) 100 MG tablet Take 100 mg by mouth daily.   Yes [provider]  sulfamethoxazole-trimethoprim (BACTRIM DS) 800-160 MG tablet Take 1 tablet by mouth daily.   Yes [provider]  ursodiol (ACTIGALL) 300 MG capsule Take 300 mg by mouth 2 (two) times daily.   Yes [provider]    Physical Exam: Vitals:   01/09/20 1230 01/09/20 1300 01/09/20 1356 01/09/20 1411  BP: 119/72 107/83 108/76 119/69  Pulse: (!) 103 (!) 109 (!) 102 96  Resp: (!) 30 (!) 35 (!) 25 20  Temp:   98.3 F (36.8 C) 98.3 F (36.8 C)  TempSrc:   Oral Oral  SpO2: 95% 96% 96% 96%     . General:  Appears calm and comfortable and is NAD; she is quite jaundiced and chronically ill appearing . Eyes:  PERRL, EOMI, normal lids, iris; +sleral icterus . ENT:  grossly normal hearing, lips & tongue, mmm . Neck:  no LAD, masses or thyromegaly . Cardiovascular:  RRR, no m/r/g. 1+ LE edema.  Marland Kitchen Respiratory:   Diminished breath sounds on the left.  Mildly  increased respiratory effort. . Abdomen:  soft, mild diffuse abdominal TTP, mild ascites . Skin:  no rash or induration seen on limited exam . Musculoskeletal:  grossly normal tone BUE/BLE, good ROM, no bony abnormality . Psychiatric:  blunted mood and affect, speech fluent and appropriate, AOx3 . Neurologic:  CN 2-12 grossly intact, moves all extremities in coordinated fashion    Radiological Exams on Admission: DG Chest Portable 1 View  Result Date: 01/09/2020 CLINICAL DATA:  Shortness of breath EXAM: PORTABLE CHEST 1 VIEW COMPARISON:  None. FINDINGS: Heart size is poorly evaluated but appears within normal limits. Lung volumes are low. Moderate bilateral pleural effusions with associated bibasilar opacities. No pneumothorax. Osseous structures appear intact. IMPRESSION: Moderate bilateral pleural effusions with associated bibasilar opacities, which may reflect a combination of atelectasis and pneumonia. Electronically Signed   By: Davina Poke D.O.   On: 01/09/2020 10:18    EKG: Independently reviewed.  NSR with rate 99; low voltage; nonspecific ST changes with no evidence of acute ischemia   Labs on Admission: I have personally reviewed the available labs and imaging studies at the time of the admission.  Pertinent labs:   CO2 20 AP 225 Albumin 2.8 AST 118/ALT 52/Bili 9.8; 102/70/7.5 on 1/4 WBC 5.3 Hgb 11.0 Platelets 37 - stable INR 1.3; improved from 2.09 on 12/07/19   Assessment/Plan Principal Problem:   Liver failure without hepatic coma (HCC) Active Problems:   Cirrhosis, cryptogenic (HCC)   Thrombocytopenia (HCC)   Hydrothorax   History of uterine cancer   Cirrhosis with liver failure -Notes from [place] indicate suspected AMA-negative PBC as the cause -Has h/o decompensated cirrhosis with varices treated with banding, h/o SBP, hepatic encephalopathy -MELD/MELD-Na score is 18/20, with a mortality rate of 6% -Bilirubin 9.8, stable -INR 1.3, improved -Continue  diuretics and propranolol -She is hoping to obtain insurance so that she can go on the liver transplant list -She has been followed at [facility] since moving to [facility] from [place]  -She apparently lives in [place] (moving to [place]) and prefers to be seen for convenience - at least periodically -Will consult GI -She has mild ascites and will need paracentesis to keep from reaccumulating pleural effusion; Dr. Carlis Abbott will attempt to perform both procedures today -Last paracentesis was on 1/13 with 2.25 L drained; she appears to have been requiring moderate-volume paracentesis every 2-3 weeks for the last couple of years -She may require routine IR procedures to prevent the need for recurrent hospitalizations  Thrombocytopenia -Chronic and stable -Likely associated with liver failure -Per [place] notes, patient is a Sales promotion account executive [employer] and will not accept blood products (she did not report this to me at the time of admission)  Recurrent pleural effusion with SOB -Patient with SOB and mild tachypnea -Large effusion by Dr Reather Converse on bedside US -Pulm consult for drainage -As noted above, Dr. 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, completed in 02/2019 -h/o radiation proctitis, without current complaints here  Lack of insurance -Patient reports that she is undocumented and trying to obtain [employer] -She does not have a job and acknowledges that this insurance will be very expensive -She is moving into a mobile home with her son in [place] and thinks that a utility bill from there is all she needs to obtain the insurance -She believes that after she obtains the insurance, she will be able to go on the transplant list at [place] team  consulted for assistance    Note: This patient has been tested and is negative for the novel coronavirus COVID-19.  DVT prophylaxis:  SCDs Code Status:  Full - confirmed with patient Family Communication: None present  Disposition Plan:  Home once clinically improved Consults called: GI/Pulmonology; TOC  Admission status: Admit - It is my clinical  opinion that admission to INPATIENT is reasonable and necessary because of the expectation that this patient will require hospital care that crosses at least 2 midnights to treat this condition based on the medical complexity of the problems presented.  Given the aforementioned information, the predictability of an adverse outcome is felt to be significant.    Karmen Bongo MD [facility]   How to contact the [facility] Attending or Consulting provider [place] or covering provider during after hours [place], for this patient?  1. Check the care team in [facility] and look for a) attending/consulting TR[facility] provider listed and b) the [facility] team listed 2. Log into [email or web address] and use [place]'s universal password to access. If you do not have the password, please contact the hospital operator. 3. Locate the [facility] provider you are looking for under [facility] and page to a number that you can be directly reached. 4. If you still have difficulty reaching the provider, please page the [facility] (Director on Call) for the Hospitalists listed on amion for assistance.   01/09/2020, 3:14 PM

## 2020-01-09 NOTE — Consult Note (Addendum)
[facility] Consult: 11:35 AM 01/09/2020  LOS: 0 days    Referring Provider: Dr Lorin Mercy  Primary Care Physician:  Patient, No Pcp Per Primary Gastroenterologist: [facility] hepatolog Dr Marzetta Merino    Reason for Consultation:  Cirrhosis, abdominal swelling.     HPI: Carmina Walle is a 44 y.o. female.  [place] resident but previously lived in [place].  PMH: Hypothyroidism.  Depression.  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    Hepatitis A.  Refractory ascites requiring paracentesis every 2 to 3 weeks for the last 2.5 years.  SBP.  Hepatic encephalopathy.  Recurrent hepatic hydrothorax requiring thoracentesis.  Endometrial cancer, radiation therapy 02/2019 in [place].  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  Thrombocytpenia, baseline platelets 50 to 100.  Splenomegaly.  S/p BTL 2011 after vaginal delivery.   Jehovah's Witness, will not accept blood transfusions, he is not unwilling to receive platelets if necessary.`.  Due to her "social situation" and lack of healthcare insurance.  Is not considered a liver transplant candidate. Liver bx 2016:  The histopathological changes suggest that more than one insult is likely contributing to the liver injury: 1) chronic biliary pathology; and 2) steatohepatitis.  Focally prominent ductular reaction with mild portal neutrophilia and persistently elevated alkaline phosphatase levels and hyperbilirubinemia are strongly suggestive of a chronic biliary insult.  However, the findings in this biopsy are not specific for a particular etiology. The differential diagnosis would include: 1) a mechanical problem, such  as biliary tract stones or stricturing, or other lesion causing suboptimal bile drainage; 2) a cholangitic adverse drug reaction; and 3) intrinsic biliary tract disease such as primary sclerosing cholangitis and primary biliary cholangitis (PBC). The relative paucity of mononuclear portal inflammation and absence of granulomas or typical bile duct lesions would make the possibility of PBC somewhat less likely, although PBC cannot be entirely excluded.  The mild macrovesicular steatosis, ballooning degeneration of occasional hepatocytes and focal perisinusoidal fibrosis suggests that steatohepatitis is also contributing to the liver injury.  Therefore, risk factors for hepatic steatosis and steatohepatitis should be minimized, if possible.  Admissions to [facility] 10/11 - 10/01/19 (encephalopathy),  11/17 -11/08/2019  (symptomatic hepatic hydrothorax).  Meld initially 18.  3.5 L paracentesis 09/25/2019, no concerns for SBP.  CTAP showing cirrhosis, portal hypertension, splenomegaly, upper abdominal varices, moderate abdominal ascites.  Notes mention blood pressure cannot tolerate higher doses of diuretics. 11/08/2019 EGD.  Grade 2, nonbleeding esophageal varix without bleeding stigmata.  Portal hypertensive gastropathy.  No banding performed. 11/15/2019 ultrasound liver w Dopplers: Cirrhosis, no focal liver lesions.  Contracted gallbladder not well visualized.  Main and right portal veins patent with hepatopetal flow. Sluggish bidirectional flow of left portal vein with normal portal vein velocity.  Splenic vein patent with hepatopetal flow.  IVC, left, middle, right hepatic veins patent with by/triphasic waveforms.  Hepatic artery patent.  Visualized proximal aorta unremarkable. AFP level 7 on 10/18/19.  During November admission she met with palliative care, pt desires aggressive care.  Admission [facility] 12/7 - 12/07/19 w symptomatic bil hepatic hydrothorax Thoracentesis 11/25/2019, 1.5 L fluid  removed; 12/01/2019 2.6L and 2.4 L removed..  Fluid studies concerning for spontaneous bacterial empyema with nucleated cell count 2950, 81% neutrophils.  Treated with 5 days of ceftriaxone. 12/05/2019 EGD with banding of 1 varix.  Consultation for TIPS as therapy for the hepatic hydrothorax but was deemed to be unsafe for TIPS given elevated INR, refusal of blood transfusions for religious reasons. Pleurx catheter also not placed due to ?  Other management strategies from the "hepatology side". Hypercalcemia and elevated PTH, low vitamin D noted.  Started vitamin D and cinacalcet.  Planned outpatient endocrine referral. Meld sodium score 24. Additional Meds at discharge included twice daily iron supplement, Lasix 40 mg/day, Aldactone 100 mg/day, Bactrim, Chronulac 20 g tid, ursodiol 300 mg bid, midodrine.  Oxycodone was discontinued.  Previously on rifaximin but this was discontinued as of the 10/2019 hospital admission.  Also discontinued was Cipro, substituted with the Bactrim.  2 g sodium diet. Discharge summary mentions patient would need repeat EGD in mid to late January impression operation for TIPS.  Also needed arranging of regular outpatient thoracentesis.  She has an appointment with hepatology for follow-up on 01/21/2020  12/24/2019 1.5 L paracentesis. Hgb 10, MCV 111 on 12/23/2019.  Hb ranging 8.9 -10 during December 2020. INR 2.0 on 12/07/2019.  Presented to the [facility] ED today with recurrent symptomatic hydrothorax: SOB and abdominal swelling bothersome for the last couple of days.   The shortness of breath is the bigger concern, she has not yet become that distended in her abdomen.  Fatigued.  Has been seeing small amounts of blood in her stool as per usual.  No lower extremity edema, which is generally uncommon for her.  Stools are brown.  Has not had any confusion or excessive somnolence.   Hgb in November 11.5.  MCV 105.  Folate, B12 elevated in 09/2019. Hb 11.  MCV 110.  Platelets 37K.   INR 1.3.  Sodium normal at 136.  BUN and creatinine normal. T bili 9.8, alkaline phosphatase 225.  AST/ALT 118/52. CXR shows moderate bilateral pleural effusions with associated opacities, may reflect combination of atelectasis and PNA. Respiratory panel ordered, do not see orders for or pndg Covid test.    Family history.  Her son was born premature in 5686, delivery complicated by meconium staining.  He required repair of an internal hernia at 13 weeks old. No family history of liver disease.  Past Medical History:  Diagnosis Date  . Cholestasis of pregnancy 1999  . 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 Depression   . Primary biliary cirrhosis ([place])   . Radiation proctitis   . Thrombocytopenia ([place])   . Uterine cancer ([place]) 2020   treated with radiation therapy in [place]    Past Surgical History:  Procedure Laterality Date  . DILATION AND CURETTAGE OF UTERUS    . TUBAL LIGATION      Prior to Admission medications   Medication Sig Start Date End Date Taking? Authorizing Provider  acetaminophen (TYLENOL) 500 MG tablet Take 1,000 mg by mouth every 12 (twelve) hours as needed for mild pain.   Yes [provider]  cholecalciferol (VITAMIN D3) 25 MCG (1000 UNIT) tablet Take 5,000 Units by mouth daily.   Yes [provider]  cinacalcet (SENSIPAR) 30 MG tablet Take 30 mg by  mouth every Monday, Wednesday, and Friday.   Yes [provider]  ferrous sulfate 325 (65 FE) MG tablet Take 325 mg by mouth 2 (two) times daily.   Yes [provider]  furosemide (LASIX) 40 MG tablet Take 40 mg by mouth daily.   Yes [provider]  lactulose (CHRONULAC) 10 GM/15ML solution Take 20 g by mouth 3 (three) times daily.   Yes [provider]  midodrine (PROAMATINE) 5 MG tablet Take 15 mg by mouth 3 (three) times daily with meals.   Yes [provider]  pantoprazole (PROTONIX) 40 MG tablet Take 40 mg by mouth daily.    Yes [provider]  spironolactone (ALDACTONE) 100 MG tablet Take 100 mg by mouth daily.   Yes [provider]  sulfamethoxazole-trimethoprim (BACTRIM DS) 800-160 MG tablet Take 1 tablet by mouth daily.   Yes [provider]  ursodiol (ACTIGALL) 300 MG capsule Take 300 mg by mouth 2 (two) times daily.   Yes [provider]    Scheduled Meds:  Infusions:  PRN Meds: fentaNYL (SUBLIMAZE) injection   Allergies as of 01/09/2020 - Review Complete 01/09/2020  Allergen Reaction Noted  . Morphine and related Other (See Comments) 01/09/2020    Family History  Problem Relation Age of Onset  . Diabetes Father   . Liver disease Neg Hx     Social History   Socioeconomic History  . Marital status: Married    Spouse name: Not on file  . Number of children: Not on file  . Years of education: Not on file  . Highest education level: Not on file  Occupational History  . Occupation: unemployed  Tobacco Use  . Smoking status: Never Smoker  . Smokeless tobacco: Never Used  Substance and Sexual Activity  . Alcohol use: Never  . Drug use: Never  . Sexual activity: Not on file  Other Topics Concern  . Not on file  Social History Narrative  . Not on file   Social Determinants of Health   Financial Resource Strain:   . Difficulty of Paying Living Expenses: Not on file  Food Insecurity:   . Worried About Charity fundraiser in the Last Year: Not on file  . Ran Out of Food in the Last Year: Not on file  Transportation Needs:   . Lack of Transportation (Medical): Not on file  . Lack of Transportation (Non-Medical): Not on file  Physical Activity:   . Days of Exercise per Week: Not on file  . Minutes of Exercise per Session: Not on file  Stress:   . Feeling of Stress : Not on file  Social Connections:   . Frequency of Communication with Friends and Family: Not on file  . Frequency of Social Gatherings with Friends and Family: Not on file  .  Attends Religious Services: Not on file  . Active Member of Clubs or Organizations: Not on file  . Attends Archivist Meetings: Not on file  . Marital Status: Not on file  Intimate Partner Violence:   . Fear of Current or Ex-Partner: Not on file  . Emotionally Abused: Not on file  . Physically Abused: Not on file  . Sexually Abused: Not on file    REVIEW OF SYSTEMS: Constitutional: Fatigue, weakness. ENT:  No nose bleeds Pulm: Shortness of breath.  Progressive over the last couple of days. CV:  No palpitations, no LE edema.  No chest pressure or pain. GU:  No hematuria, no frequency  GI: See HPI. Heme: Denies unusual or excessive bleeding or bruising. Transfusions: None. Neuro:  No headaches, no peripheral tingling or numbness.  Syncope.  No seizures. Derm:  No itching, no rash or sores.  Endocrine:  No sweats or chills.  No polyuria or dysuria Immunization: Reviewed.  She had her flu shot in October 2020. Travel:  None beyond local counties in last few months.    PHYSICAL EXAM: Vital signs in last 24 hours: Vitals:   01/09/20 1000 01/09/20 1030  BP: 115/73 118/74  Pulse: 96 100  Resp: (!) 35 19  Temp:    SpO2: 98% 95%   Wt Readings from Last 3 Encounters:  No data found for Wt    General: Jaundiced, thin, does not look acutely ill but is in some distress with her shortness of breath. Head: No facial asymmetry or swelling.  No signs of head trauma. Eyes: Icteric sclera. Ears: Not hard of hearing Nose: No congestion, no discharge Mouth: Oropharynx moist, pink, clear.  Tongue midline.  Good dentition. Neck: No JVD, no masses, no thyromegaly. Lungs: Clear bilaterally.  Some labored breathing and increased work of breathing.  Intermittent dry cough. Heart: RRR.  No MRG. Abdomen: Soft.  Not distended.  Umbilical hernia.  Not tender.  Active bowel sounds..   Rectal: Deferred Musc/Skeltl: No joint redness or swelling. Extremities: No CCE.  Limbs with slight  muscular wasting. Neurologic: Alert.  Oriented x3.  No tremors, no asterixis.  Excellent historian.  Moves all 4 limbs, strength not tested Skin: Jaundiced but she is also got deep olive skin coloring Nodes: No cervical adenopathy. Psych: Pleasant, cooperative, calm.  Intake/Output from previous day: No intake/output data recorded. Intake/Output this shift: No intake/output data recorded.  LAB RESULTS: Recent Labs    01/09/20 0857  WBC 5.3  HGB 11.0*  HCT 34.4*  PLT 37*   BMET Lab Results  Component Value Date   NA 136 01/09/2020   K 5.1 01/09/2020   CL 105 01/09/2020   CO2 20 (L) 01/09/2020   GLUCOSE 73 01/09/2020   BUN 16 01/09/2020   CREATININE 0.99 01/09/2020   CALCIUM 9.0 01/09/2020   LFT Recent Labs    01/09/20 0857  PROT 5.4*  ALBUMIN 2.8*  AST 118*  ALT 52*  ALKPHOS 225*  BILITOT 9.8*   PT/INR Lab Results  Component Value Date   INR 1.3 (H) 01/09/2020    Drugs of Abuse  No results found for: LABOPIA, COCAINSCRNUR, LABBENZ, AMPHETMU, THCU, LABBARB   RADIOLOGY STUDIES: DG Chest Portable 1 View  Result Date: 01/09/2020 CLINICAL DATA:  Shortness of breath EXAM: PORTABLE CHEST 1 VIEW COMPARISON:  None. FINDINGS: Heart size is poorly evaluated but appears within normal limits. Lung volumes are low. Moderate bilateral pleural effusions with associated bibasilar opacities. No pneumothorax. Osseous structures appear intact. IMPRESSION: Moderate bilateral pleural effusions with associated bibasilar opacities, which may reflect a combination of atelectasis and pneumonia. Electronically Signed   By: Davina Poke D.O.   On: 01/09/2020 10:18      IMPRESSION:   *   Decompensated cirrhosis with history recurrent hepatic hydrothorax, recurrent ascites, hepatic encephalopathy, esophageal varices, portal hypertensive gastropathy. Has required frequent moderately large volume paracentesis every 2 to 3 weeks in the last 2-1/2 years.  Has required several  thoracentesis in the last few months. Due to lack of insurance, lack of social support and I suspect her religious objection to blood transfusion, she is deemed a nontransplant candidate. Current MELD-Na is 20.  On chronic actigal.    *   Recurrent, symptomatic hepatic hydrothorax.  According to plans noted in [facility] discharge summary of 11/2019, she was being considered for future TIPS  *   Ascites.  Has 01/21/20 appt with Hepatology at [facility] to discuss TIPS and fup liver disease.    *   Hx non-bleeding esoph varices.  Banding in 10/2019.      *  Radiation proctitis with chronic bloody stools.    *   Thrombocytopenia.  Splenomegaly.  Platelets ordered in order to allow for thoracentesis, paracentesis.  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  *   Hx of SBP and spontaneous bacterial empyema..  Currently on Bactrim prophylaxis.  *   Hx HE.  Takes chronic lactulose.  *     Macrocytic anemia.  Previously tested and not B12 or folate deficient.  Takes chronic oral iron.Marland Kitchen    PLAN:     *    Critical care planning to perform bedside thoracentesis as well as paracentesis.  *   Pt ok to eat 2 gm Na diet   Azucena Freed  01/09/2020, 11:35 AM Phone [phone]

## 2020-01-09 NOTE — ED Triage Notes (Signed)
Pt arrives to ED from home with complaints of shortness of breath and abdominal distention since yesterday. Patient has hx of liver failure and has hx of paracentesis.

## 2020-01-09 NOTE — Consult Note (Signed)
NAME:  Hannah Donovan, MRN:  [id number], DOB:  1976-07-12, LOS: 0 ADMISSION DATE:  01/09/2020, CONSULTATION DATE: 01/09/2020 REFERRING MD: Dr. Karmen Bongo, CHIEF COMPLAINT: Consult for thoracentesis and paracentesis  Brief History   44 year old female presented with dyspnea and abdominal distention of the last 2 days, known history of end-stage cirrhosis, cryptogenic with recent paracentesis 10 days ago.  PCCM consulted for paracentesis and thoracentesis.  History of present illness   Hannah Donovan is a 44 year old female with a past medical history significant for cirrhosis, cyrptogenic, depression, thrombocytopenia, uterine cancer 2020 treated with radiation who presented with a 2-day history of worsening dyspnea and abdominal distention.  Also reports nonproductive cough. she denies any abdominal pain, fever, chills chest pain.  Patient reports she was paracentesis 10 days ago with 2.5 L removed.  Reports she has primarily received all healthcare at [facility] after moving from [place] and at that she is actively attempting to be considered for liver transplant.   Lab work on admission relatively unremarkable apart from elevated LFTs and thrombocytopenia a platelet count of 37.  Vital signs on admission significant for mild tachycardia all other values within normal limits. Chest x-ray with large left pleural effusion with ascites as well.   As above patent has known history of thrombocytopenia in the setting of liver failure with a current platelet count of 37. Therefore, we would recommend that patient receive platelt transfusion prior to procedures. Upon communicating this plan to the patient she verbalized she is a Jehovah Witness and produced documentation of her wishes to not received and blood products but she stated she would be willing to receive products if necesscary. Patient was educated on the indications for platelet transfusion in order to prevent excessive bleeding. She verbalized  understanding and again agreed to transfusion stating "I do not want to die, if I need them I will take them".   PCCM consulted for need or paracentesis and thoracentesis.   Past Medical History  Uterine cancer  Thrombocytopenia  Cryptogenic cirrhosis  Depression Cholestasis of pregnancy   Significant Hospital Events   Admitted 01/21  Consults:  PCCM  Procedures:  Thoracentesis and Paracentesis 01/21  Significant Diagnostic Tests:  CXR 01/21 > Moderate bilateral pleural effusions with associated bibasilar opacities, which may reflect a combination of atelectasis and pneumonia.  Micro Data:  COVID 01/21 > negative   Antimicrobials:  Home prophylactic Bactrim   Interim history/subjective:  Lying in bed in slight discomfort but no distress, she states she understands indication and possible complications and wishes to proceeded.  Objective   Blood pressure 110/75, pulse (!) 102, temperature 98.3 F (36.8 C), temperature source Oral, resp. rate (!) 30, SpO2 96 %.       No intake or output data in the 24 hours ending 01/09/20 1315 There were no vitals filed for this visit.  Examination: General: Chronically ill appearing cachetic adult female, in NAD HEENT: [place]/AT, sclera icteric, MM pink/moist, PERRL,  Neuro: Alert and oriented x3, able to follow all commands, non-focal  CV: s1s2 regular rate and rhythm, no murmur, rubs, or gallops,  PULM:  Crackles to bilateral bases, slight increased work of breathing with prolonged verbal communication GI: soft, bowel sounds active in all 4 quadrants, non-tender, distended, protruding umbilicus  Extremities: warm/dry, no edema  Skin: no rashes or lesions   Resolved Hospital Problem list     Assessment & Plan:  Decompensated cryptogenic cirrhosis with esophageal varices and protal hypertension   Recurrent hepatic hydrothorax Recurrent abdominal  ascites  -Patient has been receiving the majority of her care through the [facility] health  system and per review of records she has been requiring frequent moderate volume paracentesis every 2-3 weeks over the last 35yrs and several thoracentesis over the last few months  -Patient reports she is trying to purse liver transplant but due to lack of insurance, lack of support, and likely religious objections to blood products she has been deemed not a transplant candidate.  -MELD-NA score 20, 6% mortality  P: Type and screen  Transfuse 1 unit Platelets Once transfusion started will preform bedside paracentesis and thoracentesis  Will likely require albumin post fluid removal GI consulted, appreciate assistance  Continue home lasix, midodrine, aldactone, and lactulose   History of SBP and spontaneous bacterial empyema  -On prophylactic bactrim at baseline  P: Obtain culture from abdominal and pleural fluid No acute signs of infection currently  Follow fever curve and WBC Continue home Bactrim   Thrombocytopenia  -Platelet count 37 on admission -After discussion on indications and risk patient has agreed to receive platelet transfusion prior to procedures  P: Transfusion as above Trend CBC Follow for signs of bleeding     Rest of management per primary team.    Best practice:  Diet: NPO Pain/Anxiety/Delirium protocol (if indicated): PRNS VAP protocol (if indicated): N/A DVT prophylaxis: SCD, thrombocytopenic  GI prophylaxis: PPI Glucose control: Monitor  Mobility: Up with assistance Code Status: Full  Family Communication: Updated by patient  Disposition: Floor   Labs   CBC: Recent Labs  Lab 01/09/20 0857  WBC 5.3  NEUTROABS 3.8  HGB 11.0*  HCT 34.4*  MCV 110.3*  PLT 37*    Basic Metabolic Panel: Recent Labs  Lab 01/09/20 0857  NA 136  K 5.1  CL 105  CO2 20*  GLUCOSE 73  BUN 16  CREATININE 0.99  CALCIUM 9.0   GFR: CrCl cannot be calculated (Unknown ideal weight.). Recent Labs  Lab 01/09/20 0857  WBC 5.3    Liver Function  Tests: Recent Labs  Lab 01/09/20 0857  AST 118*  ALT 52*  ALKPHOS 225*  BILITOT 9.8*  PROT 5.4*  ALBUMIN 2.8*   No results for input(s): LIPASE, AMYLASE in the last 168 hours. No results for input(s): AMMONIA in the last 168 hours.  ABG No results found for: PHART, PCO2ART, PO2ART, HCO3, TCO2, ACIDBASEDEF, O2SAT   Coagulation Profile: Recent Labs  Lab 01/09/20 0857  INR 1.3*    Cardiac Enzymes: No results for input(s): CKTOTAL, CKMB, CKMBINDEX, TROPONINI in the last 168 hours.  HbA1C: No results found for: HGBA1C  CBG: No results for input(s): GLUCAP in the last 168 hours.  Review of Systems: Positive in bold   Gen: Denies fever, chills, weight change, fatigue, night sweats HEENT: Denies blurred vision, double vision, hearing loss, tinnitus, sinus congestion, rhinorrhea, sore throat, neck stiffness, dysphagia PULM: Denies shortness of breath, cough, sputum production, hemoptysis, wheezing CV: Denies chest pain, edema, orthopnea, paroxysmal nocturnal dyspnea, palpitations GI: Denies abdominal pain, nausea, vomiting, diarrhea, hematochezia, melena, constipation, change in bowel habits, abdominal distension  GU: Denies dysuria, hematuria, polyuria, oliguria, urethral discharge Endocrine: Denies hot or cold intolerance, polyuria, polyphagia or appetite change Derm: Denies rash, dry skin, scaling or peeling skin change Heme: Denies easy bruising, bleeding, bleeding gums Neuro: Denies headache, numbness, weakness, slurred speech, loss of memory or consciousness  Past Medical History  She,  has a past medical history of Cholestasis of pregnancy (1999), Cirrhosis, 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   Surgical History    Past Surgical History:  Procedure Laterality Date  . DILATION AND CURETTAGE OF UTERUS    . TUBAL LIGATION       Social History   reports that she  has never smoked. She has never used smokeless tobacco. She reports that she does not drink alcohol or use drugs.   Family History   Her family history includes Diabetes in her father. There is no history of Liver disease.   Allergies Allergies  Allergen Reactions  . Morphine And Related Other (See Comments)    BP drops     Home Medications  Prior to Admission medications   Medication Sig Start Date End Date Taking? Authorizing Provider  acetaminophen (TYLENOL) 500 MG tablet Take 1,000 mg by mouth every 12 (twelve) hours as needed for mild pain.   Yes [provider]  cholecalciferol (VITAMIN D3) 25 MCG (1000 UNIT) tablet Take 5,000 Units by mouth daily.   Yes [provider]  cinacalcet (SENSIPAR) 30 MG tablet Take 30 mg by mouth every Monday, Wednesday, and Friday.   Yes [provider]  ferrous sulfate 325 (65 FE) MG tablet Take 325 mg by mouth 2 (two) times daily.   Yes [provider]  furosemide (LASIX) 40 MG tablet Take 40 mg by mouth daily.   Yes [provider]  lactulose (CHRONULAC) 10 GM/15ML solution Take 20 g by mouth 3 (three) times daily.   Yes [provider]  midodrine (PROAMATINE) 5 MG tablet Take 15 mg by mouth 3 (three) times daily with meals.   Yes [provider]  pantoprazole (PROTONIX) 40 MG tablet Take 40 mg by mouth daily.   Yes [provider]  spironolactone (ALDACTONE) 100 MG tablet Take 100 mg by mouth daily.   Yes [provider]  sulfamethoxazole-trimethoprim (BACTRIM DS) 800-160 MG tablet Take 1 tablet by mouth daily.   Yes [provider]  ursodiol (ACTIGALL) 300 MG capsule Take 300 mg by mouth 2 (two) times daily.   Yes [provider]     Signature:   Johnsie Cancel, NP-C [facility] Contact / Pager information can be found on Amion  01/09/2020, 1:55 PM

## 2020-01-10 ENCOUNTER — Inpatient Hospital Stay (HOSPITAL_COMMUNITY): Payer: Medicaid Other

## 2020-01-10 DIAGNOSIS — K7290 Hepatic failure, unspecified without coma: Principal | ICD-10-CM

## 2020-01-10 DIAGNOSIS — K766 Portal hypertension: Secondary | ICD-10-CM

## 2020-01-10 DIAGNOSIS — Z9889 Other specified postprocedural states: Secondary | ICD-10-CM

## 2020-01-10 DIAGNOSIS — D696 Thrombocytopenia, unspecified: Secondary | ICD-10-CM

## 2020-01-10 DIAGNOSIS — J918 Pleural effusion in other conditions classified elsewhere: Secondary | ICD-10-CM

## 2020-01-10 LAB — PROTIME-INR
INR: 1.5 — ABNORMAL HIGH (ref 0.8–1.2)
Prothrombin Time: 18.3 seconds — ABNORMAL HIGH (ref 11.4–15.2)

## 2020-01-10 LAB — GRAM STAIN
Gram Stain: NONE SEEN
Gram Stain: NONE SEEN

## 2020-01-10 LAB — COMPREHENSIVE METABOLIC PANEL
ALT: 41 U/L (ref 0–44)
AST: 70 U/L — ABNORMAL HIGH (ref 15–41)
Albumin: 2.8 g/dL — ABNORMAL LOW (ref 3.5–5.0)
Alkaline Phosphatase: 159 U/L — ABNORMAL HIGH (ref 38–126)
Anion gap: 9 (ref 5–15)
BUN: 17 mg/dL (ref 6–20)
CO2: 20 mmol/L — ABNORMAL LOW (ref 22–32)
Calcium: 8.8 mg/dL — ABNORMAL LOW (ref 8.9–10.3)
Chloride: 107 mmol/L (ref 98–111)
Creatinine, Ser: 0.91 mg/dL (ref 0.44–1.00)
GFR calc Af Amer: >60 mL/min (ref >60)
GFR calc non Af Amer: >60 mL/min (ref >60)
Glucose, Bld: 84 mg/dL (ref 70–99)
Potassium: 4 mmol/L (ref 3.5–5.1)
Sodium: 136 mmol/L (ref 135–145)
Total Bilirubin: 8.7 mg/dL — ABNORMAL HIGH (ref 0.3–1.2)
Total Protein: 4.6 g/dL — ABNORMAL LOW (ref 6.5–8.1)

## 2020-01-10 LAB — CBC
HCT: 27.3 % — ABNORMAL LOW (ref 36.0–46.0)
HCT: 29.2 % — ABNORMAL LOW (ref 36.0–46.0)
Hemoglobin: 8.9 g/dL — ABNORMAL LOW (ref 12.0–15.0)
Hemoglobin: 9.5 g/dL — ABNORMAL LOW (ref 12.0–15.0)
MCH: 35.7 pg — ABNORMAL HIGH (ref 26.0–34.0)
MCH: 35.1 pg — ABNORMAL HIGH (ref 26.0–34.0)
MCHC: 32.6 g/dL (ref 30.0–36.0)
MCHC: 32.5 g/dL (ref 30.0–36.0)
MCV: 109.6 fL — ABNORMAL HIGH (ref 80.0–100.0)
MCV: 107.7 fL — ABNORMAL HIGH (ref 80.0–100.0)
Platelets: 27 10*3/uL — CL (ref 150–400)
Platelets: 27 10*3/uL — CL (ref 150–400)
RBC: 2.49 MIL/uL — ABNORMAL LOW (ref 3.87–5.11)
RBC: 2.71 MIL/uL — ABNORMAL LOW (ref 3.87–5.11)
RDW: 16.1 % — ABNORMAL HIGH (ref 11.5–15.5)
RDW: 15.9 % — ABNORMAL HIGH (ref 11.5–15.5)
WBC: 3.7 10*3/uL — ABNORMAL LOW (ref 4.0–10.5)
WBC: 5.1 10*3/uL (ref 4.0–10.5)
nRBC: 0 % (ref 0.0–0.2)
nRBC: 0 % (ref 0.0–0.2)

## 2020-01-10 LAB — BODY FLUID CELL COUNT WITH DIFFERENTIAL
Eos, Fluid: 1 %
Lymphs, Fluid: 37 %
Monocyte-Macrophage-Serous Fluid: 52 % (ref 50–90)
Neutrophil Count, Fluid: 10 % (ref 0–25)
Total Nucleated Cell Count, Fluid: 66 cu mm (ref 0–1000)

## 2020-01-10 LAB — PREPARE PLATELET PHERESIS: Unit division: 0

## 2020-01-10 LAB — PROTEIN, TOTAL: Total Protein: 4.8 g/dL — ABNORMAL LOW (ref 6.5–8.1)

## 2020-01-10 LAB — BPAM PLATELET PHERESIS
Blood Product Expiration Date: 202101222359
ISSUE DATE / TIME: 202101211352
Unit Type and Rh: 5100

## 2020-01-10 LAB — HIV ANTIBODY (ROUTINE TESTING W REFLEX): HIV Screen 4th Generation wRfx: NONREACTIVE

## 2020-01-10 LAB — PROTEIN, PLEURAL OR PERITONEAL FLUID: Total protein, fluid: <3 g/dL

## 2020-01-10 LAB — LACTATE DEHYDROGENASE, PLEURAL OR PERITONEAL FLUID: LD, Fluid: 45 U/L — ABNORMAL HIGH (ref 3–23)

## 2020-01-10 LAB — CYTOLOGY - NON PAP

## 2020-01-10 LAB — LACTATE DEHYDROGENASE: LDH: 195 U/L — ABNORMAL HIGH (ref 98–192)

## 2020-01-10 MED ORDER — SPIRONOLACTONE 25 MG PO TABS
150.0000 mg | ORAL_TABLET | Freq: Every day | ORAL | Status: DC
  Administered 2020-01-11 – 2020-01-14 (×4): 150 mg via ORAL
  Filled 2020-01-10 (×4): qty 6

## 2020-01-10 MED ORDER — ALPRAZOLAM 0.25 MG PO TABS
0.2500 mg | ORAL_TABLET | Freq: Once | ORAL | Status: AC
  Administered 2020-01-10: 0.25 mg via ORAL
  Filled 2020-01-10: qty 1

## 2020-01-10 MED ORDER — FUROSEMIDE 40 MG PO TABS
60.0000 mg | ORAL_TABLET | Freq: Every day | ORAL | Status: DC
  Administered 2020-01-11 – 2020-01-14 (×4): 60 mg via ORAL
  Filled 2020-01-10 (×4): qty 1

## 2020-01-10 NOTE — Procedures (Signed)
Thoracentesis Procedure Note  Pre-operative Diagnosis: Right pleural effusion  Post-operative Diagnosis: normal  Indications: Therapeutic and Diagnostic Thoracentesis  Procedure Details  Consent: Informed consent was obtained. Risks of the procedure were discussed including: infection, bleeding, pain, pneumothorax.  Under sterile conditions the patient was positioned. Betadine solution and sterile drapes were utilized.  2% buffered lidocaine was used to anesthetize the 5th  rib space. Fluid was obtained without any difficulties and minimal blood loss.  A dressing was applied to the wound and wound care instructions were provided.   Findings 1000 ml of straw colored clear pleural fluid was obtained. A sample was sent to Pathology for cytogenetics, flow, and cell counts, as well as for infection analysis.  Complications:  None; patient tolerated the procedure well.          Condition: stable  Plan A follow up chest x-ray was ordered. Bed Rest for 2 hours. Tylenol 650 mg. for pain.    The procedure was performed with use of live time [place] to identify location of the diaphragm and  a pocket of pleural fluid. The patient had a platelet count of 27,000. She is a Sales promotion account executive Witness and she refused transfusion pf platelets prior to procedure. She understood the risk of bleeding. There was minimal blood loss during the procedure. .Pressure was held to the area for 5 minutes post procedure. She tolerated the procedure well. CXR is pending.    Magdalen Spatz, MSN, AGACNP-BC [place] Pager # [phone] After 4 pm please call [phone] 01/10/2020 10:07 AM

## 2020-01-10 NOTE — Consult Note (Signed)
NAME:  Hannah Donovan, MRN:  [id number], DOB:  [birth year], LOS: 1 ADMISSION DATE:  01/09/2020, CONSULTATION DATE: 01/09/2020 REFERRING MD: Dr. Karmen Bongo, CHIEF COMPLAINT: Consult for thoracentesis and paracentesis  Brief History   44 year old female admitted 01/09/20 with two day history of dyspnea and abdominal distention. Known history of end-stage cirrhosis, cryptogenic with recent paracentesis 10 days ago with 2.5L removed.  PCCM consulted for paracentesis and thoracentesis.  She has primarily received all healthcare at [facility] after moving from [place] and at that she is actively attempting to be considered for liver transplant.   Lab work on admission relatively unremarkable apart from elevated LFTs and thrombocytopenia a platelet count of 37.  Chest x-ray with large left pleural effusion with ascites as well.   Past Medical History  Uterine cancer - treated with radiation in 2020 Thrombocytopenia  Cryptogenic cirrhosis  Depression Cholestasis of pregnancy   Significant Hospital Events   1/21 Admit, L thora, paracentesis  1/22 R thora   Consults:  PCCM  Procedures:     Significant Diagnostic Tests:  CXR 01/21 > Moderate bilateral pleural effusions with associated bibasilar opacities, which may reflect a combination of atelectasis and pneumonia.  Micro Data:  COVID 01/21 > negative   Antimicrobials:  Home prophylactic Bactrim   Interim history/subjective:  Pt reports she finally slept well last night.  Reports breathing is better. Remains on [place] O2.   Objective   Blood pressure 106/66, pulse 90, temperature 99 F (37.2 C), temperature source Oral, resp. rate (!) 26, height 5\' 1"  (1.549 m), weight 47.1 kg, SpO2 95 %.        Intake/Output Summary (Last 24 hours) at 01/10/2020 1043 Last data filed at 01/10/2020 0900 Gross per 24 hour  Intake 503.85 ml  Output 450 ml  Net 53.85 ml   Filed Weights   01/10/20 0739  Weight: 47.1 kg    Examination: General: small  adult female sitting up in bed in NAD HEENT: MM pink/moist, mild icterus, lids/lashes normal Neuro: AAOx4, speech clear, MAE CV: s1s2 RRR, SR on monitor 100's, no m/r/g PULM: tachypnea but no distress, clear anterior, diminished bases posterior, dullness to percussion 1/2 way up on right posterior GI: soft, bsx4 active  Extremities: warm/dry, no edema  Skin: no rashes or lesions  Resolved Hospital Problem list     Assessment & Plan:   Acute Hypoxemic Respiratory Failure secondary to Bilateral Pleural Effusions & Abdominal Ascites in setting of Cryptogenic Cirrhosis  -wean O2 for sats >90% -will need ambulatory O2 needs assessment prior to discharge -plan for right thoracentesis 1/22 for fluid analysis, cytology  -follow up CXR post procedure  -given frequency of repeat thoracentesis, consider palliative care involvement   Decompensated Cryptogenic Cirrhosis with Esophageal Varices, Portal Hypertension   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 She is trying to purse liver transplant but due to lack of insurance, lack of support, and likely religious objections to blood products she has been deemed not a transplant candidate. MELD-NA score 20, 6% mortality  -Per GI / Primary MD -continue prophylactic bactrim  -follow up at [facility] with Hepatology post discharge  Thrombocytopenia  -Patient requested to hold further transfusion 1/22  -Follow CBC  -Monitor for bleeding  Best practice:  Diet: Per primary  Pain/Anxiety/Delirium protocol (if indicated): PRNS VAP protocol (if indicated): N/A DVT prophylaxis: SCD, thrombocytopenic  GI prophylaxis: PPI Glucose control: Monitor  Mobility: Up with  assistance Code Status: Full  Family Communication: Updated patient at bedside  Disposition: Floor   Labs   CBC: Recent Labs  Lab 01/09/20 0857 01/10/20 0423  WBC 5.3 3.7*  NEUTROABS 3.8  --   HGB 11.0* 8.9*  HCT 34.4* 27.3*  MCV 110.3* 109.6*  PLT 37* 27*    Basic Metabolic Panel: Recent Labs  Lab 01/09/20 0857 01/10/20 0423  NA 136 136  K 5.1 4.0  CL 105 107  CO2 20* 20*  GLUCOSE 73 84  BUN 16 17  CREATININE 0.99 0.91  CALCIUM 9.0 8.8*   GFR: Estimated Creatinine Clearance: 59.3 mL/min (by C-G formula based on SCr of 0.91 mg/dL). Recent Labs  Lab 01/09/20 0857 01/10/20 0423  WBC 5.3 3.7*    Liver Function Tests: Recent Labs  Lab 01/09/20 0857 01/10/20 0423  AST 118* 70*  ALT 52* 41  ALKPHOS 225* 159*  BILITOT 9.8* 8.7*  PROT 5.4* 4.6*  ALBUMIN 2.8* 2.8*   No results for input(s): LIPASE, AMYLASE in the last 168 hours. No results for input(s): AMMONIA in the last 168 hours.  ABG No results found for: PHART, PCO2ART, PO2ART, HCO3, TCO2, ACIDBASEDEF, O2SAT   Coagulation Profile: Recent Labs  Lab 01/09/20 0857 01/10/20 0423  INR 1.3* 1.5*    Cardiac Enzymes: No results for input(s): CKTOTAL, CKMB, CKMBINDEX, TROPONINI in the last 168 hours.  HbA1C: No results found for: HGBA1C  CBG: No results for input(s): GLUCAP in the last 168 hours.   Signature:    Noe Gens, MSN, NP-C [place] 01/10/2020, 10:43 AM   Please see [email or web address] for pager details.

## 2020-01-10 NOTE — Plan of Care (Signed)
  Problem: Activity: Goal: Risk for activity intolerance will decrease Outcome: Progressing   

## 2020-01-10 NOTE — Progress Notes (Signed)
[facility] notified patient is upset had family member to pass and several members are sick requesting medication to help relax.Arthor Captain

## 2020-01-10 NOTE — Progress Notes (Signed)
Progress Note    ASSESSMENT AND PLAN:   Complicated 44 yo female with complicated liver history. She has AMA negative PBC / cirrhosis (MELD 20) complicated by ascites requiring serial LVPs, esophageal variceal bleeding, hx of SBP on chronic Bactrim, , thrombocytopenia / hypersplenism, hepatic encephalopathy, hepatic hydrothorax with associated spontaneous bacterial empyema. Recently moved from [place] to be closer to [facility] where she has been receiving care. Hasn't been transplant candidate due to funding and lack of support. TIPS has been entertained by [facility] Hepatology team.   Ascites / bilateral pleural effusions:  Post bilateral thoracentesis (left yesterday, right side today). No evidence for infection on yesterday's fluid, today's fluid studies pending. She also had a paracentesis done yesterday but due to discomfort could only tolerate about 1000 ml off. No evidence for SBP. Today on exam she doesn't have tense ascites, in fact she is quite tympanitic. She isn't uncomfortable with distention. --At this point will hold off on repeat paracentesis. Will increase diuretics to lasix 60 mg / aldactone 150 mg daily. She is diligent with low salt diet it sounds like.  --Will defer decision regarding TIPS to her [facility] team. She has follow up there on 01/21/20.   --Lactulose makes her gassy but hasn't been able to afford Xifaxan. We discussed the need for continuing Lactulose. Mentation is okay, no asterixis  2. Hx of stage 2 endometrial cancer s/p radiation resulting in radiation proctitis with associated bleeding.   3. Jehovah's Witness    SUBJECTIVE   Breathing is much better now. She has been having abdominal discomfort since what sounds like her last paracentesis at [facility] a few weeks ago. No other complaints at present. Hoping to get insurance / some type funding soon to qualify for transplant.    OBJECTIVE:     Vital signs in last 24 hours: Temp:  [98 F (36.7 C)-99 F (37.2 C)] 99  F (37.2 C) (01/22 0518) Pulse Rate:  [84-113] 90 (01/22 1003) Resp:  [19-36] 26 (01/22 1003) BP: (101-119)/(61-83) 106/66 (01/22 1003) SpO2:  [87 %-98 %] 95 % (01/22 1003) Weight:  [47.1 kg] 47.1 kg (01/22 0739) Last BM Date: 01/06/21 General:   Alert, well-developed female in NAD Heart:  Regular rate and rhythm;  No lower extremity edema   Pulm: Normal respiratory effort   Abdomen:  Soft, moderately distended, tympanitic, mild diffuse lower tenderness  Normal bowel sounds.          Neurologic:  Alert and  oriented x4;  grossly normal neurologically. No asterixis Psych:  Pleasant, cooperative.  Normal mood and affect.   Intake/Output from previous day: 01/21 0701 - 01/22 0700 In: 143.9 [P.O.:120; IV Piggyback:23.9] Out: 450 [Urine:450] Intake/Output this shift: Total I/O In: 360 [P.O.:360] Out: -   Lab Results: Recent Labs    01/09/20 0857 01/10/20 0423  WBC 5.3 3.7*  HGB 11.0* 8.9*  HCT 34.4* 27.3*  PLT 37* 27*   BMET Recent Labs    01/09/20 0857 01/10/20 0423  NA 136 136  K 5.1 4.0  CL 105 107  CO2 20* 20*  GLUCOSE 73 84  BUN 16 17  CREATININE 0.99 0.91  CALCIUM 9.0 8.8*   LFT Recent Labs    01/10/20 0423 01/10/20 0423 01/10/20 0426  PROT 4.6*   < > 4.8*  ALBUMIN 2.8*  --   --   AST 70*  --   --   ALT 41  --   --   ALKPHOS 159*  --   --  BILITOT 8.7*  --   --    < > = values in this interval not displayed.   PT/INR Recent Labs    01/09/20 0857 01/10/20 0423  LABPROT 16.0* 18.3*  INR 1.3* 1.5*   Hepatitis Panel No results for input(s): HEPBSAG, HCVAB, HEPAIGM, HEPBIGM in the last 72 hours.  DG CHEST PORT 1 VIEW  Result Date: 01/10/2020 CLINICAL DATA:  Status post thoracentesis EXAM: PORTABLE CHEST 1 VIEW COMPARISON:  Chest radiograph from one day prior. FINDINGS: Stable cardiomediastinal silhouette with normal heart size. Tiny residual right hydropneumothorax at the right costophrenic angle, with the pleural effusion component decreased.  Moderate to large left pleural effusion, significantly increased. No pulmonary edema. Residual hazy patchy right lung base opacity. Worsening aeration at the left lung base. IMPRESSION: 1. Tiny residual right hydropneumothorax at the right costophrenic angle, with decreased pleural effusion component. 2. Moderate to large left pleural effusion, significantly increased. 3. Hazy residual right lung base opacity and worsening left lung base opacity, favor atelectasis. These results will be called to the ordering clinician or representative by the Radiologist Assistant, and communication documented in the PACS or zVision Dashboard. Electronically Signed   By: Ilona Sorrel M.D.   On: 01/10/2020 10:24   DG CHEST PORT 1 VIEW  Result Date: 01/09/2020 CLINICAL DATA:  Pleural effusions EXAM: PORTABLE CHEST 1 VIEW COMPARISON:  Portable exam 1647 hours compared to 10/11 hours FINDINGS: Normal heart size and mediastinal contours. BILATERAL small to moderate pleural effusions with bibasilar atelectasis. Upper lungs clear. No pneumothorax or acute osseous findings. IMPRESSION: Persistent bibasilar pleural effusions and atelectasis. Electronically Signed   By: Lavonia Dana M.D.   On: 01/09/2020 17:23   DG Chest Portable 1 View  Result Date: 01/09/2020 CLINICAL DATA:  Shortness of breath EXAM: PORTABLE CHEST 1 VIEW COMPARISON:  None. FINDINGS: Heart size is poorly evaluated but appears within normal limits. Lung volumes are low. Moderate bilateral pleural effusions with associated bibasilar opacities. No pneumothorax. Osseous structures appear intact. IMPRESSION: Moderate bilateral pleural effusions with associated bibasilar opacities, which may reflect a combination of atelectasis and pneumonia. Electronically Signed   By: Davina Poke D.O.   On: 01/09/2020 10:18    Principal Problem:   Liver failure without hepatic coma ([place]) Active Problems:   Cirrhosis of liver with ascites (HCC)   Thrombocytopenia (HCC)    Recurrent pleural effusion on left   History of uterine cancer   Pleural effusion   S/P thoracentesis     LOS: 1 day   Tye Savoy ,NP 01/10/2020, 12:44 PM

## 2020-01-10 NOTE — Plan of Care (Signed)
  Problem: Clinical Measurements: Goal: Ability to maintain clinical measurements within normal limits will improve Outcome: Progressing Goal: Will remain free from infection Outcome: Progressing Goal: Diagnostic test results will improve Outcome: Progressing Goal: Respiratory complications will improve Outcome: Progressing Goal: Cardiovascular complication will be avoided Outcome: Progressing   Problem: Activity: Goal: Risk for activity intolerance will decrease Outcome: Progressing   Problem: Nutrition: Goal: Adequate nutrition will be maintained Outcome: Progressing   Problem: Coping: Goal: Level of anxiety will decrease Outcome: Progressing   Problem: Elimination: Goal: Will not experience complications related to bowel motility Outcome: Progressing Goal: Will not experience complications related to urinary retention Outcome: Progressing   Problem: Pain Managment: Goal: General experience of comfort will improve Outcome: Progressing   

## 2020-01-10 NOTE — Progress Notes (Signed)
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  Patient was found to have symptomatic large pleural effusion underwent 1.5 L thoracentesis after platelet transfusion by critical care.  She was admitted for further management  Subjective:  Resting comfortably denies chest pain nausea vomiting abdominal pain.  Has abdominal fullness. Feels much better after thoracentesis this morning. Status post thoracentesis again this morning by PCCM Overnight no fever, on 3 L nasal cannula saturating 95%, blood pressure 104/65. Labs showed improving LFTs but total bili up 9.8->8.7, hemoglobin 8.9 g, platelet count 27K.  Assessment & Plan:   Decompensated liver cirrhosis liver failure without hepatic coma, with right hydrothorax, ascites, coagulopathy inr 1.5: Followed at [facility] hepatology has had extensive work-up.  Meld- NA 20 on admission.  Status post paracentesis 1 L and thoracentesis 1.5 L.  No evidence of infection on fluid analysis.  Appreciate GI and pulmonary input.  Continue her home midodrine, Lasix, Aldactone, lactulose, prophylactic Bactrim.  Symptomatic bilateral pleural effusion: s/p 1.5 thoracentesis 1/21: Total nucleated cell counts 26,80, neutrophil  10,7.  Gram stain no organism no WBC, culture pending.  I cannot right thoracentesis today-1 L of straw-colored pleural fluid was removed.  X-ray postprocedure shows tiny residual right hydropneumothorax with decreased pleural effusion, moderate to large left pleural effusion significantly increased.  Will likely need additional thoracentesis chest x-ray for the morning, sooner if has respiratory distress. PCCM following.  history of SBP/spontaneous bacterial empyema: Follow-up fluid analysis and cultures.  Continue home prophylactic Bactrim.  Hx of hepatic encephalopathy: Continue her home lactulose, monitor clinically with neuro checks.    Thrombocytopenia/hypersplenism: Monitor platelet count.  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  Anemia of chronic disease due to cirrhosis/Jehovah's Witness: hb drop 2 units overnight.  Monitor CBC this afternoon. Patient stated in the future if she needs blood transfusion she will think about it.  Leukopenia again suspecting hypersplenism monitor  Lack of insurance/undocumented: This is further causing problem in her management due to limited resources/follow-ups.  TOC consulted.  Patient believes after obtaining insurance she will be able to go on the transplant list at [facility]  Body mass index is 19.62 kg/m.    DVT prophylaxis: SCD Code Status: Full code Family Communication: plan of care discussed with patient at bedside. Disposition Plan: Remains inpatient pending clinical improvement, and signing off consultant.   Consultants: PCCM, GI Procedures:see note Microbiology: Ascitic fluid pleural fluid culture pending Antimicrobials: Anti-infectives (From admission, onward)   Start     Dose/Rate Route Frequency Ordered Stop   01/09/20 1145   sulfamethoxazole-trimethoprim (BACTRIM DS) 800-160 MG per tablet 1 tablet     1 tablet Oral Daily 01/09/20 1135        Medications: Scheduled Meds: . sodium chloride   Intravenous Once  .  cinacalcet  30 mg Oral Q M,W,F  . furosemide  40 mg Oral Daily  . lactulose  20 g Oral TID  . midodrine  15 mg Oral TID WC  . pantoprazole  40 mg Oral Daily  . sodium chloride flush  3 mL Intravenous Q12H  . spironolactone  100 mg Oral Daily  . sulfamethoxazole-trimethoprim  1 tablet Oral Daily  . ursodiol  300 mg Oral BID   Continuous Infusions:  Objective: Vitals:   01/09/20 1648 01/09/20 2221 01/10/20 0518 01/10/20 0739  BP: 118/63 101/65 104/65   Pulse: 95 84 84   Resp: 20 20 19    Temp: 98 F (36.7 C) 98 F (36.7 C) 99 F (37.2 C)   TempSrc: Oral Oral Oral   SpO2: 98% 94% 95%   Weight:    47.1 kg  Height:    5\' 1"  (1.549 m)    Intake/Output Summary (Last 24 hours) at 01/10/2020 0823 Last data filed at 01/09/2020 2200 Gross per 24 hour  Intake 143.85 ml  Output 450 ml  Net -306.15 ml   Filed Weights   01/10/20 0739  Weight: 47.1 kg   Weight change:   Body mass index is 19.62 kg/m.  Intake/Output from previous day: 01/21 0701 - 01/22 0700 In: 143.9 [P.O.:120; IV Piggyback:23.9] Out: 450 [Urine:450] Intake/Output this shift: No intake/output data recorded.  Examination:  General exam: AAOx3, older than stated age, thin, not in distress, weak looking. HEENT:Oral mucosa moist, Ear/Nose WNL grossly, dentition normal. Respiratory system: Diminished breath sounds on the left lung , no wheezing or crackles,no use of accessory muscle Cardiovascular system: S1 & S2 +, No JVD,. Gastrointestinal system: Abdomen soft, NT, appears full mild to moderately distended , BS+ Nervous System:Alert, awake, moving extremities and grossly nonfocal Extremities: No edema, distal peripheral pulses palpable.  Skin: No rashes,no icterus. MSK: Normal muscle bulk,tone, power   Data Reviewed:  I have personally reviewed following labs and imaging studies  CBC: Recent Labs  Lab 01/09/20 0857 01/10/20 0423  WBC 5.3 3.7*  NEUTROABS 3.8  --   HGB 11.0* 8.9*  HCT 34.4* 27.3*  MCV 110.3* 109.6*  PLT 37* 27*   Basic Metabolic Panel: Recent Labs  Lab 01/09/20 0857 01/10/20 0423  NA 136 136  K 5.1 4.0  CL 105 107  CO2 20* 20*  GLUCOSE 73 84  BUN 16 17  CREATININE 0.99 0.91  CALCIUM 9.0 8.8*   GFR: Estimated Creatinine Clearance: 59.3 mL/min (by C-G formula based on SCr of 0.91 mg/dL). Liver Function Tests: Recent Labs  Lab 01/09/20 0857 01/10/20 0423  AST 118* 70*  ALT 52* 41  ALKPHOS 225* 159*  BILITOT 9.8* 8.7*  PROT 5.4* 4.6*  ALBUMIN 2.8* 2.8*   No results for input(s): LIPASE, AMYLASE in the last 168 hours. No results for input(s): AMMONIA in the last 168 hours. Coagulation Profile: Recent Labs  Lab 01/09/20 0857 01/10/20 0423  INR 1.3* 1.5*   Cardiac Enzymes: No results for input(s): CKTOTAL, CKMB, CKMBINDEX, TROPONINI in the last 168 hours. BNP (last 3 results) No results for input(s): PROBNP in the last 8760 hours. HbA1C: No results for input(s): HGBA1C in the last 72 hours. CBG: No results for input(s): GLUCAP in the last 168 hours. Lipid Profile: No results for input(s): CHOL, HDL, LDLCALC, TRIG, CHOLHDL, LDLDIRECT in the last 72 hours. Thyroid Function Tests: No results for input(s): TSH, T4TOTAL, FREET4, T3FREE, THYROIDAB in the last 72 hours. Anemia Panel: No results for input(s): VITAMINB12,  FOLATE, FERRITIN, TIBC, IRON, RETICCTPCT in the last 72 hours. Sepsis Labs: No results for input(s): PROCALCITON, LATICACIDVEN in the last 168 hours.  Recent Results (from the past 240 hour(s))  Respiratory Panel by RT PCR (Flu A&B, Covid) - Nasopharyngeal Swab     Status: None   Collection Time: 01/09/20 10:33 AM   Specimen: Nasopharyngeal Swab  Result Value Ref Range Status   SARS Coronavirus 2 by RT PCR NEGATIVE NEGATIVE Final     Comment: (NOTE) SARS-CoV-2 target nucleic acids are NOT DETECTED. The SARS-CoV-2 RNA is generally detectable in upper respiratoy specimens during the acute phase of infection. The lowest concentration of SARS-CoV-2 viral copies this assay can detect is 131 copies/mL. A negative result does not preclude SARS-Cov-2 infection and should not be used as the sole basis for treatment or other patient management decisions. A negative result may occur with  improper specimen collection/handling, submission of specimen other than nasopharyngeal swab, presence of viral mutation(s) within the areas targeted by this assay, and inadequate number of viral copies (<131 copies/mL). A negative result must be combined with clinical observations, patient history, and epidemiological information. The expected result is Negative. Fact Sheet for Patients:  PinkCheek.be Fact Sheet for Healthcare Providers:  GravelBags.it This test is not yet ap proved or cleared by the [place] FDA and  has been authorized for detection and/or diagnosis of SARS-CoV-2 by FDA under an Emergency Use Authorization (EUA). This EUA will remain  in effect (meaning this test can be used) for the duration of the COVID-19 declaration under Section 564(b)(1) of the Act, 21 U.S.C. section 360bbb-3(b)(1), unless the authorization is terminated or revoked sooner.    Influenza A by PCR NEGATIVE NEGATIVE Final   Influenza B by PCR NEGATIVE NEGATIVE Final    Comment: (NOTE) The Xpert Xpress SARS-CoV-2/FLU/RSV assay is intended as an aid in  the diagnosis of influenza from Nasopharyngeal swab specimens and  should not be used as a sole basis for treatment. Nasal washings and  aspirates are unacceptable for Xpert Xpress SARS-CoV-2/FLU/RSV  testing. Fact Sheet for Patients: PinkCheek.be Fact Sheet for Healthcare Providers:  GravelBags.it This test is not yet approved or cleared by the [place] FDA and  has been authorized for detection and/or diagnosis of SARS-CoV-2 by  FDA under an Emergency Use Authorization (EUA). This EUA will remain  in effect (meaning this test can be used) for the duration of the  Covid-19 declaration under Section 564(b)(1) of the Act, 21  U.S.C. section 360bbb-3(b)(1), unless the authorization is  terminated or revoked. Performed at [facility], [place] [address], [place], [place] [address]   Gram stain     Status: None   Collection Time: 01/09/20  4:24 PM   Specimen: Fluid  Result Value Ref Range Status   Specimen Description FLUID PLEURAL LEFT  Final   Special Requests NONE  Final   Gram Stain   Final    NO WBC SEEN NO ORGANISMS SEEN Performed at [facility], [place] [address] [place], [place] [address]    Report Status 01/10/2020 FINAL  Final  Culture, body fluid-bottle     Status: None (Preliminary result)   Collection Time: 01/09/20  4:24 PM   Specimen: Fluid  Result Value Ref Range Status   Specimen Description FLUID PERITONEAL  Final   Special Requests BOTTLES DRAWN AEROBIC ONLY  Final   Culture PENDING  Incomplete   Report Status PENDING  Incomplete  Gram stain  Status: None   Collection Time: 01/09/20  4:24 PM   Specimen: Fluid  Result Value Ref Range Status   Specimen Description FLUID PERITONEAL  Final   Special Requests NONE  Final   Gram Stain   Final    NO WBC SEEN NO ORGANISMS SEEN Performed at [facility], [address], [place], [place] [address]    Report Status 01/10/2020 FINAL  Final      Radiology Studies: DG CHEST PORT 1 VIEW  Result Date: 01/09/2020 CLINICAL DATA:  Pleural effusions EXAM: PORTABLE CHEST 1 VIEW COMPARISON:  Portable exam 1647 hours compared to 10/11 hours FINDINGS: Normal heart size and mediastinal contours. BILATERAL small to moderate pleural effusions with bibasilar  atelectasis. Upper lungs clear. No pneumothorax or acute osseous findings. IMPRESSION: Persistent bibasilar pleural effusions and atelectasis. Electronically Signed   By: Lavonia Dana M.D.   On: 01/09/2020 17:23   DG Chest Portable 1 View  Result Date: 01/09/2020 CLINICAL DATA:  Shortness of breath EXAM: PORTABLE CHEST 1 VIEW COMPARISON:  None. FINDINGS: Heart size is poorly evaluated but appears within normal limits. Lung volumes are low. Moderate bilateral pleural effusions with associated bibasilar opacities. No pneumothorax. Osseous structures appear intact. IMPRESSION: Moderate bilateral pleural effusions with associated bibasilar opacities, which may reflect a combination of atelectasis and pneumonia. Electronically Signed   By: Davina Poke D.O.   On: 01/09/2020 10:18      LOS: 1 day   Time spent: More than 50% of that time was spent in counseling and/or coordination of care.  Antonieta Pert, MD [facility]  01/10/2020, 8:23 AM

## 2020-01-11 ENCOUNTER — Inpatient Hospital Stay (HOSPITAL_COMMUNITY): Payer: Medicaid Other

## 2020-01-11 ENCOUNTER — Encounter (HOSPITAL_COMMUNITY): Payer: Self-pay | Admitting: Internal Medicine

## 2020-01-11 DIAGNOSIS — Z531 Procedure and treatment not carried out because of patient's decision for reasons of belief and group pressure: Secondary | ICD-10-CM

## 2020-01-11 DIAGNOSIS — R0902 Hypoxemia: Secondary | ICD-10-CM

## 2020-01-11 DIAGNOSIS — R0600 Dyspnea, unspecified: Secondary | ICD-10-CM

## 2020-01-11 HISTORY — DX: Procedure and treatment not carried out because of patient's decision for reasons of belief and group pressure: Z53.1

## 2020-01-11 LAB — COMPREHENSIVE METABOLIC PANEL
ALT: 52 U/L — ABNORMAL HIGH (ref 0–44)
AST: 92 U/L — ABNORMAL HIGH (ref 15–41)
Albumin: 2.8 g/dL — ABNORMAL LOW (ref 3.5–5.0)
Alkaline Phosphatase: 197 U/L — ABNORMAL HIGH (ref 38–126)
Anion gap: 11 (ref 5–15)
BUN: 11 mg/dL (ref 6–20)
CO2: 19 mmol/L — ABNORMAL LOW (ref 22–32)
Calcium: 8.8 mg/dL — ABNORMAL LOW (ref 8.9–10.3)
Chloride: 106 mmol/L (ref 98–111)
Creatinine, Ser: 0.87 mg/dL (ref 0.44–1.00)
GFR calc Af Amer: >60 mL/min (ref >60)
GFR calc non Af Amer: >60 mL/min (ref >60)
Glucose, Bld: 96 mg/dL (ref 70–99)
Potassium: 4.1 mmol/L (ref 3.5–5.1)
Sodium: 136 mmol/L (ref 135–145)
Total Bilirubin: 10.2 mg/dL — ABNORMAL HIGH (ref 0.3–1.2)
Total Protein: 5.2 g/dL — ABNORMAL LOW (ref 6.5–8.1)

## 2020-01-11 LAB — CBC
HCT: 32.7 % — ABNORMAL LOW (ref 36.0–46.0)
Hemoglobin: 10.7 g/dL — ABNORMAL LOW (ref 12.0–15.0)
MCH: 35.3 pg — ABNORMAL HIGH (ref 26.0–34.0)
MCHC: 32.7 g/dL (ref 30.0–36.0)
MCV: 107.9 fL — ABNORMAL HIGH (ref 80.0–100.0)
Platelets: 27 10*3/uL — CL (ref 150–400)
RBC: 3.03 MIL/uL — ABNORMAL LOW (ref 3.87–5.11)
RDW: 15.9 % — ABNORMAL HIGH (ref 11.5–15.5)
WBC: 6.1 10*3/uL (ref 4.0–10.5)
nRBC: 0 % (ref 0.0–0.2)

## 2020-01-11 MED ORDER — HYOSCYAMINE SULFATE 0.125 MG SL SUBL
0.2500 mg | SUBLINGUAL_TABLET | Freq: Four times a day (QID) | SUBLINGUAL | Status: DC | PRN
  Administered 2020-01-11: 0.25 mg via ORAL
  Filled 2020-01-11 (×2): qty 2

## 2020-01-11 NOTE — Progress Notes (Addendum)
Patient asking pertinent questions re: her diagnosis and prognosis.  Requires MD to speak with her as I feel her ideals re: a high quality of life are unrealistic.  She expresses the wish to return home soon to care for her 44 y.o. son, who is currently with her cousin.  Patient has increased weakness and dyspnea with very little activity.  I expressed to her that she needed to care for herself before she could care for him and we would make sure that this happened for her.  Continue to await for pulmonary consult.  Dr. Gloriann Loan again notified.

## 2020-01-11 NOTE — Progress Notes (Signed)
Bedside Left thoracentesis performed by Dr. Nelda Marseille with support from medical / nursing / rapid response as per protocol.  Pre procedure, patient expressed increased dyspnea with very little mobility.  Unable to auscultate left lung field sounds.  She was visibly in distress.  Post procedure, patient expressed increased comfort and was able to deep breath / cough with minimal effort, according to patient.  LUL fields clear, but I am unable to auscultate LL base.

## 2020-01-11 NOTE — Plan of Care (Signed)
  Problem: Education: Goal: Knowledge of General Education information will improve Description Including pain rating scale, medication(s)/side effects and non-pharmacologic comfort measures Outcome: Progressing   Problem: Health Behavior/Discharge Planning: Goal: Ability to manage health-related needs will improve Outcome: Progressing   Problem: Clinical Measurements: Goal: Ability to maintain clinical measurements within normal limits will improve Outcome: Progressing Goal: Will remain free from infection Outcome: Progressing   Problem: Nutrition: Goal: Adequate nutrition will be maintained Outcome: Progressing   Problem: Pain Managment: Goal: General experience of comfort will improve Outcome: Progressing   Problem: Safety: Goal: Ability to remain free from injury will improve Outcome: Progressing   

## 2020-01-11 NOTE — Progress Notes (Addendum)
PROGRESS NOTE    Myrle Bahri  [id number] DOB: Jul 03, 1976 DOA: 01/09/2020 PCP: Patient, No Pcp Per   Brief Narrative: 44 year old female with history of liver cirrhosis due to [place] (reports previous liver biopsy in [place]), with cholestasis of pregnancy in 1999, being followed by hepatology clinic at [facility], previous history of thoracentesis and  LV paracentesis, history of SBP/spontaneous bacterial empyema, on chronic prophylactic Bactrim, history of hepatic encephalopathy, thrombocytopenia/hypersplenism, severe protein calorie malnutrition, frequent hospitalization, 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  Patient was found to have symptomatic large pleural effusion underwent 1.5 L thoracentesis after platelet transfusion by critical care.  She was admitted for further management  Subjective: Patient with worsening dyspnea this morning although she reports her abdominal discomfort and swelling is much improved. She remains stable on 2.5 L of O2. She admits to right sided chest discomfort that is mild to moderate in nature that is worse with deep breathing. She denies any fever, chills, N/V/D, abdominal discomfort. ROS is otherwise negative except what is noted above.    Objective: Vitals:   01/11/20 0841 01/11/20 0900 01/11/20 1000 01/11/20 1100  BP: 110/64     Pulse: (!) 102     Resp: (!) 24     Temp:      TempSrc:      SpO2: 95% 94% 95% 93%  Weight:      Height:        Intake/Output Summary (Last 24 hours) at 01/11/2020 1245 Last data filed at 01/11/2020 0800 Gross per 24 hour  Intake 440 ml  Output 350 ml  Net 90 ml   Filed Weights   01/10/20 0739  Weight: 47.1 kg   Weight change:   Body mass index is 19.62 kg/m.  Intake/Output from previous day: 01/22 0701 - 01/23 0700 In: 600  [P.O.:600] Out: 350 [Urine:350] Intake/Output this shift: Total I/O In: 200 [P.O.:200] Out: -   Examination:  General exam: AAOx3, older than stated age, thin, not in distress, weak looking. HEENT:Oral mucosa moist, Ear/Nose WNL grossly, dentition normal. Respiratory system: Diminished breath sounds on the left lung, no wheezing or crackles,no use of accessory muscle, mild tachypnea Cardiovascular system: S1 & S2 +, No JVD,. Gastrointestinal system: Abdomen soft, NT, minimally distended , BS+ Nervous System:Alert, awake, moving extremities and grossly nonfocal Extremities: No edema, distal peripheral pulses palpable.  Skin: No rashes,no icterus. 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  Meld- NA 20 on admission.  Status post paracentesis 1 L and thoracentesis 1.5 L.  No evidence of infection on fluid analysis.  Appreciate GI and pulmonary input.  Continue her home midodrine, Lasix, Aldactone, lactulose, prophylactic Bactrim. CBC, CMP, INR in AM  Symptomatic bilateral pleural effusion: s/p 1.5 thoracentesis 1/21: Total nucleated cell counts 26,80, neutrophil 10,7.  Gram stain no organism no WBC, culture pending. Right thoracentesis on 1/22-1 L of straw-colored pleural fluid was removed.  X-ray postprocedure shows tiny residual right hydropneumothorax with decreased pleural effusion, moderate to large left pleural effusion significantly increased. Repeat CXR this morning reveals large left hemithorax and collapse of the left lung.  PCCM following and have reached out with PCCM today as well.  history of SBP/spontaneous bacterial empyema: Follow-up fluid analysis  and cultures.  Continue home prophylactic Bactrim.  Hx of hepatic encephalopathy: Continue her home lactulose, monitor clinically with neuro  checks.    Thrombocytopenia/hypersplenism: Monitor platelet count.   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  Anemia of chronic disease due to cirrhosis/Jehovah's Witness: Patient stated in the future if she needs blood transfusion she will think about it.  Leukopenia again suspecting hypersplenism monitor  Lack of insurance/undocumented: This is further causing problem in her management due to limited resources/follow-ups.  TOC consulted.  Patient believes after obtaining insurance she will be able to go on the transplant list at [facility]  Body mass index is 19.62 kg/m.    DVT prophylaxis: SCD, no AC due to thrombocytopenia Code Status: Full code Family Communication: plan of care discussed with patient at bedside. Disposition Plan: Remains inpatient pending clinical improvement, and signing off consultant.   Consultants: PCCM, GI Procedures:see note Microbiology: Ascitic fluid pleural fluid culture pending Antimicrobials: Anti-infectives (From admission, onward)   Start     Dose/Rate Route Frequency Ordered Stop   01/09/20 1145  sulfamethoxazole-trimethoprim (BACTRIM DS) 800-160 MG per tablet 1 tablet     1 tablet Oral Daily 01/09/20 1135        Medications: Scheduled Meds: . sodium chloride   Intravenous Once  . cinacalcet  30 mg Oral Q M,W,F  . furosemide  60 mg Oral Daily  . lactulose  20 g Oral TID  . midodrine  15 mg Oral TID WC  . pantoprazole  40 mg Oral Daily  . sodium chloride flush  3 mL Intravenous Q12H  . spironolactone  150 mg Oral Daily  . sulfamethoxazole-trimethoprim  1 tablet Oral Daily  . ursodiol  300 mg Oral BID   Continuous Infusions:  Data Reviewed: I have personally reviewed following labs and imaging studies  CBC: Recent Labs  Lab 01/09/20 0857 01/10/20 0423 01/10/20 1449 01/11/20 0637    WBC 5.3 3.7* 5.1 6.1  NEUTROABS 3.8  --   --   --   HGB 11.0* 8.9* 9.5* 10.7*  HCT 34.4* 27.3* 29.2* 32.7*  MCV 110.3* 109.6* 107.7* 107.9*  PLT 37* 27* 27* 27*   Basic Metabolic Panel: Recent Labs  Lab 01/09/20 0857 01/10/20 0423 01/11/20 0637  NA 136 136 136  K 5.1 4.0 4.1  CL 105 107 106  CO2 20* 20* 19*  GLUCOSE 73 84 96  BUN 16 17 11   CREATININE 0.99 0.91 0.87  CALCIUM 9.0 8.8* 8.8*   GFR: Estimated Creatinine Clearance: 62 mL/min (by C-G formula based on SCr of 0.87 mg/dL). Liver Function Tests: Recent Labs  Lab 01/09/20 0857 01/10/20 0423 01/10/20 0426 01/11/20 0637  AST 118* 70*  --  92*  ALT 52* 41  --  52*  ALKPHOS 225* 159*  --  197*  BILITOT 9.8* 8.7*  --  10.2*  PROT 5.4* 4.6* 4.8* 5.2*  ALBUMIN 2.8* 2.8*  --  2.8*   No results for input(s): LIPASE, AMYLASE in the last 168 hours. No results for input(s): AMMONIA in the last 168 hours. Coagulation Profile: Recent Labs  Lab 01/09/20 0857 01/10/20 0423  INR 1.3* 1.5*   Cardiac Enzymes: No results for input(s): CKTOTAL, CKMB, CKMBINDEX, TROPONINI in the last 168 hours. BNP (last 3 results) No results for input(s): PROBNP in the last 8760 hours. HbA1C: No results for input(s): HGBA1C in the last 72 hours. CBG: No results  for input(s): GLUCAP in the last 168 hours. Lipid Profile: No results for input(s): CHOL, HDL, LDLCALC, TRIG, CHOLHDL, LDLDIRECT in the last 72 hours. Thyroid Function Tests: No results for input(s): TSH, T4TOTAL, FREET4, T3FREE, THYROIDAB in the last 72 hours. Anemia Panel: No results for input(s): VITAMINB12, FOLATE, FERRITIN, TIBC, IRON, RETICCTPCT in the last 72 hours. Sepsis Labs: No results for input(s): PROCALCITON, LATICACIDVEN in the last 168 hours.  Recent Results (from the past 240 hour(s))  Respiratory Panel by RT PCR (Flu A&B, Covid) - Nasopharyngeal Swab     Status: None   Collection Time: 01/09/20 10:33 AM   Specimen: Nasopharyngeal Swab  Result Value Ref  Range Status   SARS Coronavirus 2 by RT PCR NEGATIVE NEGATIVE Final    Comment: (NOTE) SARS-CoV-2 target nucleic acids are NOT DETECTED. The SARS-CoV-2 RNA is generally detectable in upper respiratoy specimens during the acute phase of infection. The lowest concentration of SARS-CoV-2 viral copies this assay can detect is 131 copies/mL. A negative result does not preclude SARS-Cov-2 infection and should not be used as the sole basis for treatment or other patient management decisions. A negative result may occur with  improper specimen collection/handling, submission of specimen other than nasopharyngeal swab, presence of viral mutation(s) within the areas targeted by this assay, and inadequate number of viral copies (<131 copies/mL). A negative result must be combined with clinical observations, patient history, and epidemiological information. The expected result is Negative. Fact Sheet for Patients:  PinkCheek.be Fact Sheet for Healthcare Providers:  GravelBags.it This test is not yet ap proved or cleared by the [place] FDA and  has been authorized for detection and/or diagnosis of SARS-CoV-2 by FDA under an Emergency Use Authorization (EUA). This EUA will remain  in effect (meaning this test can be used) for the duration of the COVID-19 declaration under Section 564(b)(1) of the Act, 21 U.S.C. section 360bbb-3(b)(1), unless the authorization is terminated or revoked sooner.    Influenza A by PCR NEGATIVE NEGATIVE Final   Influenza B by PCR NEGATIVE NEGATIVE Final    Comment: (NOTE) The Xpert Xpress SARS-CoV-2/FLU/RSV assay is intended as an aid in  the diagnosis of influenza from Nasopharyngeal swab specimens and  should not be used as a sole basis for treatment. Nasal washings and  aspirates are unacceptable for Xpert Xpress SARS-CoV-2/FLU/RSV  testing. Fact Sheet for  Patients: PinkCheek.be Fact Sheet for Healthcare Providers: GravelBags.it This test is not yet approved or cleared by the [place] FDA and  has been authorized for detection and/or diagnosis of SARS-CoV-2 by  FDA under an Emergency Use Authorization (EUA). This EUA will remain  in effect (meaning this test can be used) for the duration of the  Covid-19 declaration under Section 564(b)(1) of the Act, 21  U.S.C. section 360bbb-3(b)(1), unless the authorization is  terminated or revoked. Performed at [facility], [place] [address] [place], [place] [address]   Culture, body fluid-bottle     Status: None (Preliminary result)   Collection Time: 01/09/20  4:24 PM   Specimen: Fluid  Result Value Ref Range Status   Specimen Description FLUID PLEURAL LEFT  Final   Special Requests BOTTLES DRAWN AEROBIC ONLY  Final   Culture   Final    NO GROWTH 2 DAYS Performed at [facility], [address], [place], [place] [address]    Report Status PENDING  Incomplete  Gram stain     Status: None   Collection Time: 01/09/20  4:24  PM   Specimen: Fluid  Result Value Ref Range Status   Specimen Description FLUID PLEURAL LEFT  Final   Special Requests NONE  Final   Gram Stain   Final    NO WBC SEEN NO ORGANISMS SEEN Performed at [facility], [address] [place], [place] [address]    Report Status 01/10/2020 FINAL  Final  Culture, body fluid-bottle     Status: None (Preliminary result)   Collection Time: 01/09/20  4:24 PM   Specimen: Fluid  Result Value Ref Range Status   Specimen Description FLUID PERITONEAL  Final   Special Requests BOTTLES DRAWN AEROBIC ONLY  Final   Culture   Final    NO GROWTH 2 DAYS Performed at [facility], [place] [address] [place], [place] [address]    Report Status PENDING  Incomplete  Gram stain     Status: None   Collection Time: 01/09/20  4:24 PM   Specimen: Fluid  Result  Value Ref Range Status   Specimen Description FLUID PERITONEAL  Final   Special Requests NONE  Final   Gram Stain   Final    NO WBC SEEN NO ORGANISMS SEEN Performed at [facility], [address], [place], [place] [address]    Report Status 01/10/2020 FINAL  Final  Body fluid culture     Status: None (Preliminary result)   Collection Time: 01/10/20  9:43 AM   Specimen: Pleura; Body Fluid  Result Value Ref Range Status   Specimen Description PLEURAL RIGHT  Final   Special Requests NONE  Final   Gram Stain NO WBC SEEN NO ORGANISMS SEEN   Final   Culture   Final    NO GROWTH < 24 HOURS Performed at [facility], [address], [place], [place] [address]    Report Status PENDING  Incomplete      Radiology Studies: DG CHEST PORT 1 VIEW  Result Date: 01/11/2020 CLINICAL DATA:  Pleural effusion EXAM: PORTABLE CHEST 1 VIEW COMPARISON:  01/10/2020 FINDINGS: Progression of left effusion with complete opacification left hemithorax and collapse of the left lung. Small right effusion and right lower lobe airspace disease unchanged. IMPRESSION: Progression of large left effusion which now occupies entire left hemithorax and collapse of the left lung. Recommend thoracentesis. Right lower lobe airspace disease and right effusion unchanged. Electronically Signed   By: Franchot Gallo M.D.   On: 01/11/2020 09:03   DG CHEST PORT 1 VIEW  Result Date: 01/10/2020 CLINICAL DATA:  Pleural effusion, had thoracentesis on 01/09/2020 EXAM: PORTABLE CHEST 1 VIEW COMPARISON:  Portable exam 1628 hours compared to 1006 hours FINDINGS: Stable heart size and mediastinal contours. Small RIGHT pleural effusion increased from previous exam. Very large LEFT pleural effusion increased from previous exam. Significant atelectasis of entirety of LEFT lung and of RIGHT lung base. No pneumothorax or acute osseous findings. IMPRESSION: Very large LEFT and small LEFT pleural effusions increased since earlier study.  Electronically Signed   By: Lavonia Dana M.D.   On: 01/10/2020 16:46   DG CHEST PORT 1 VIEW  Result Date: 01/10/2020 CLINICAL DATA:  Status post thoracentesis EXAM: PORTABLE CHEST 1 VIEW COMPARISON:  Chest radiograph from one day prior. FINDINGS: Stable cardiomediastinal silhouette with normal heart size. Tiny residual right hydropneumothorax at the right costophrenic angle, with the pleural effusion component decreased. Moderate to large left pleural effusion, significantly increased. No pulmonary edema. Residual hazy patchy right lung base opacity. Worsening aeration at  the left lung base. IMPRESSION: 1. Tiny residual right hydropneumothorax at the right costophrenic angle, with decreased pleural effusion component. 2. Moderate to large left pleural effusion, significantly increased. 3. Hazy residual right lung base opacity and worsening left lung base opacity, favor atelectasis. These results will be called to the ordering clinician or representative by the Radiologist Assistant, and communication documented in the PACS or zVision Dashboard. Electronically Signed   By: Ilona Sorrel M.D.   On: 01/10/2020 10:24   DG CHEST PORT 1 VIEW  Result Date: 01/09/2020 CLINICAL DATA:  Pleural effusions EXAM: PORTABLE CHEST 1 VIEW COMPARISON:  Portable exam 1647 hours compared to 10/11 hours FINDINGS: Normal heart size and mediastinal contours. BILATERAL small to moderate pleural effusions with bibasilar atelectasis. Upper lungs clear. No pneumothorax or acute osseous findings. IMPRESSION: Persistent bibasilar pleural effusions and atelectasis. Electronically Signed   By: Lavonia Dana M.D.   On: 01/09/2020 17:23      LOS: 2 days    Arlan Organ, MD [facility]  01/11/2020, 12:45 PM

## 2020-01-11 NOTE — Progress Notes (Signed)
Patient tachypneic.  Maintains O2 via n/c at 2.5 lpm.  MD aware of results of am CXR.  Will continue to monitor patient closely.  Pulmonary to see.  Affect anxious.  Unable to auscultate lung sounds throughout left lung fields.

## 2020-01-11 NOTE — Progress Notes (Signed)
CROSS COVER [facility] Subjective: Hannah Donovan is a unfortunate 44 year old Hispanic female, who gives a history of being diagnosed with cholestasis of pregnancy in 1999 and AMA negative PBC in 2014.  She claims she was started on Urso after she was diagnosed with PBC but her history is been complicated with cirrhosis letter meld of 20 ascites requiring serial large-volume paracentesis bleeding esophageal varices history of SBP requiring chronic Bactrim use for prophylaxis hepatic encephalopathy, hepatic hydrothorax and spontaneous bacterial pain complicated by thrombocytopenia and hypersplenism.  She receiving all her care at [facility] and has an appointment there within the next week states acutely short of breath is complaining of abdominal bloating after she received a dose of lactulose.  She is anorexic and is unable to eat much at all.  She seems to be very anxious about her condition due to lack of finances and and the lack of a definite plan in the near future.  He was diagnosed with endometrial cancer last and was treated with radiation as she could not undergo surgery due to severe thrombocytopenia.  This was complicated by the development of radiation proctitis. She is a Restaurant manager, fast food.  She had 1500 cc of pleural fluid removed today.  Pleural fluid was sent to pathology for cytogenetics flow studies and cell counts.  Objective: Vital signs in last 24 hours: Temp:  [98.2 F (36.8 C)-98.9 F (37.2 C)] 98.4 F (36.9 C) (01/23 0512) Pulse Rate:  [82-102] 102 (01/23 0841) Resp:  [16-26] 24 (01/23 0841) BP: (105-123)/(64-73) 110/64 (01/23 0841) SpO2:  [90 %-95 %] 95 % (01/23 0841) Last BM Date: 01/10/20  Intake/Output from previous day: 01/22 0701 - 01/23 0700 In: 600 [P.O.:600] Out: 350 [Urine:350] Intake/Output this shift: No intake/output data recorded.  General appearance: appears stated age, cachectic, fatigued, icteric, moderate distress, pale and toxic appearing. Neck is  supple chest clear to auscultation S1-S2 regular abdomen soft moderately distended with hypoactive bowel sounds nontender. She is alert and oriented x 4 and grossly normal from a neurologic standpoint with no asterixis  Lab Results: Recent Labs    01/10/20 0423 01/10/20 1449 01/11/20 0637  WBC 3.7* 5.1 6.1  HGB 8.9* 9.5* 10.7*  HCT 27.3* 29.2* 32.7*  PLT 27* 27* 27*   BMET Recent Labs    01/09/20 0857 01/10/20 0423 01/11/20 0637  NA 136 136 136  K 5.1 4.0 4.1  CL 105 107 106  CO2 20* 20* 19*  GLUCOSE 73 84 96  BUN 16 17 11   CREATININE 0.99 0.91 0.87  CALCIUM 9.0 8.8* 8.8*   LFT Recent Labs    01/11/20 0637  PROT 5.2*  ALBUMIN 2.8*  AST 92*  ALT 52*  ALKPHOS 197*  BILITOT 10.2*   PT/INR Recent Labs    01/09/20 0857 01/10/20 0423  LABPROT 16.0* 18.3*  INR 1.3* 1.5*   Studies/Results: DG CHEST PORT 1 VIEW  Result Date: 01/11/2020 CLINICAL DATA:  Pleural effusion EXAM: PORTABLE CHEST 1 VIEW COMPARISON:  01/10/2020 FINDINGS: Progression of left effusion with complete opacification left hemithorax and collapse of the left lung. Small right effusion and right lower lobe airspace disease unchanged. IMPRESSION: Progression of large left effusion which now occupies entire left hemithorax and collapse of the left lung. Recommend thoracentesis. Right lower lobe airspace disease and right effusion unchanged. Electronically Signed   By: Franchot Gallo M.D.   On: 01/11/2020 09:03   DG CHEST PORT 1 VIEW  Result Date: 01/10/2020 CLINICAL DATA:  Pleural effusion, had thoracentesis  on 01/09/2020 EXAM: PORTABLE CHEST 1 VIEW COMPARISON:  Portable exam 1628 hours compared to 1006 hours FINDINGS: Stable heart size and mediastinal contours. Small RIGHT pleural effusion increased from previous exam. Very large LEFT pleural effusion increased from previous exam. Significant atelectasis of entirety of LEFT lung and of RIGHT lung base. No pneumothorax or acute osseous findings. IMPRESSION:  Very large LEFT and small LEFT pleural effusions increased since earlier study. Electronically Signed   By: Lavonia Dana M.D.   On: 01/10/2020 16:46   DG CHEST PORT 1 VIEW  Result Date: 01/10/2020 CLINICAL DATA:  Status post thoracentesis EXAM: PORTABLE CHEST 1 VIEW COMPARISON:  Chest radiograph from one day prior. FINDINGS: Stable cardiomediastinal silhouette with normal heart size. Tiny residual right hydropneumothorax at the right costophrenic angle, with the pleural effusion component decreased. Moderate to large left pleural effusion, significantly increased. No pulmonary edema. Residual hazy patchy right lung base opacity. Worsening aeration at the left lung base. IMPRESSION: 1. Tiny residual right hydropneumothorax at the right costophrenic angle, with decreased pleural effusion component. 2. Moderate to large left pleural effusion, significantly increased. 3. Hazy residual right lung base opacity and worsening left lung base opacity, favor atelectasis. These results will be called to the ordering clinician or representative by the Radiologist Assistant, and communication documented in the PACS or zVision Dashboard. Electronically Signed   By: Ilona Sorrel M.D.   On: 01/10/2020 10:24   DG CHEST PORT 1 VIEW  Result Date: 01/09/2020 CLINICAL DATA:  Pleural effusions EXAM: PORTABLE CHEST 1 VIEW COMPARISON:  Portable exam 1647 hours compared to 10/11 hours FINDINGS: Normal heart size and mediastinal contours. BILATERAL small to moderate pleural effusions with bibasilar atelectasis. Upper lungs clear. No pneumothorax or acute osseous findings. IMPRESSION: Persistent bibasilar pleural effusions and atelectasis. Electronically Signed   By: Lavonia Dana M.D.   On: 01/09/2020 17:23   DG Chest Portable 1 View  Result Date: 01/09/2020 CLINICAL DATA:  Shortness of breath EXAM: PORTABLE CHEST 1 VIEW COMPARISON:  None. FINDINGS: Heart size is poorly evaluated but appears within normal limits. Lung volumes are  low. Moderate bilateral pleural effusions with associated bibasilar opacities. No pneumothorax. Osseous structures appear intact. IMPRESSION: Moderate bilateral pleural effusions with associated bibasilar opacities, which may reflect a combination of atelectasis and pneumonia. Electronically Signed   By: Davina Poke D.O.   On: 01/09/2020 10:18   Medications: I have reviewed the patient's current medications.  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 Hepatic encephalopathy requiring Lactulose and Rifaximin. Patient has a follow-up at [facility] next week; her work-up has been somewhat limited due to lack of insurance and funds.  Further recommendation to be made by the hepatology department at [facility] we will continue supportive care here for now along with ursodiol for her PBC and daily Bactrim  for SBP prophylaxis. 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    LOS: 2 days   Juanita Craver 01/11/2020, 9:53 AM

## 2020-01-11 NOTE — Progress Notes (Signed)
NAME:  Hannah Donovan, MRN:  [id number], DOB:  October 04, 1976, LOS: 2 ADMISSION DATE:  01/09/2020, CONSULTATION DATE: 01/09/2020 REFERRING MD: Dr. Karmen Bongo, CHIEF COMPLAINT: Consult for thoracentesis and paracentesis  Brief History   44 year old female admitted 01/09/20 with two day history of dyspnea and abdominal distention. Known history of end-stage cirrhosis, cryptogenic with recent paracentesis 10 days ago with 2.5L removed.  PCCM consulted for paracentesis and thoracentesis.  She has primarily received all healthcare at [facility] after moving from [place] and at that she is actively attempting to be considered for liver transplant.   Lab work on admission relatively unremarkable apart from elevated LFTs and thrombocytopenia a platelet count of 37.  Chest x-ray with large left pleural effusion with ascites as well.   Past Medical History  Uterine cancer - treated with radiation in 2020 Thrombocytopenia  Cryptogenic cirrhosis  Depression Cholestasis of pregnancy   Significant Hospital Events   1/21 Admit, L thora, paracentesis  1/22 R thora   Consults:  PCCM  Procedures:     Significant Diagnostic Tests:  CXR 01/21 > Moderate bilateral pleural effusions with associated bibasilar opacities, which may reflect a combination of atelectasis and pneumonia.  Micro Data:  COVID 01/21 > negative   Antimicrobials:  Home prophylactic Bactrim   Interim history/subjective:  No events overnight More SOB this AM  Objective   Blood pressure 113/77, pulse 94, temperature 98.5 F (36.9 C), temperature source Oral, resp. rate (!) 22, height 5\' 1"  (1.549 m), weight 47.1 kg, SpO2 95 %.        Intake/Output Summary (Last 24 hours) at 01/11/2020 1540 Last data filed at 01/11/2020 1250 Gross per 24 hour  Intake 440 ml  Output 350 ml  Net 90 ml   Filed Weights   01/10/20 0739  Weight: 47.1 kg    Examination: General: Skinny adult female, jaundiced, NAD HEENT: [place]/AT, PERRL, EOM-I and  MMM Neuro: Alert and oriented, moving all ext to command CV: RRR, Nl S1/S2 and -M/R/G PULM: No BS on the left, clear on the right GI: Soft, NT, ND and +BS Extremities: warm/dry, no edema  Skin: no rashes or lesions  I reviewed CXR myself, near complete opacification of the left lung with some midline shift  Resolved Hospital Problem list     Assessment & Plan:   Acute Hypoxemic Respiratory Failure secondary to Bilateral Pleural Effusions & Abdominal Ascites in setting of Cryptogenic Cirrhosis  - Titrate O2 for at of 90-95% - Ambulatory desaturation study prior to discharge to qualify for home O2 - Left thora done today, fluid not sent for analysis since already done and is transudative  - CXR post procedure - Will need to contact hepatologist on Monday to see appropriate approach of tips vs pleurex, fluid is accumulating too fast.  Decompensated Cryptogenic Cirrhosis with Esophageal Varices, Portal Hypertension   Recurrent Hepatic Hydrothorax & Ascites Hx SBP, Spontaneous Bacterial Empyema  Patient has been receiving the majority of her care through the [facility] health system and per review of records she has been requiring frequent moderate volume paracentesis every 2-3 weeks over the last 62yrs and several thoracentesis over the last few months. She is trying to purse liver transplant but due to lack of insurance, lack of support, and likely religious objections to blood products she has been deemed not a transplant candidate. MELD-NA score 20, 6% mortality  - Per GI / Primary MD - Continue prophylactic bactrim  - Follow up at [facility] with Hepatology post discharge  Thrombocytopenia  - Hold further platelet transfusion - Follow CBC  - Monitor for bleeding   Discussed with PCCM-NP  Best practice:  Diet: Per primary  Pain/Anxiety/Delirium protocol (if indicated): PRNS VAP protocol (if indicated): N/A DVT prophylaxis: SCD, thrombocytopenic  GI prophylaxis: PPI Glucose control: Monitor   Mobility: Up with assistance Code Status: Full  Family Communication: Updated patient at bedside  Disposition: Floor   Labs   CBC: Recent Labs  Lab 01/09/20 0857 01/10/20 0423 01/10/20 1449 01/11/20 0637  WBC 5.3 3.7* 5.1 6.1  NEUTROABS 3.8  --   --   --   HGB 11.0* 8.9* 9.5* 10.7*  HCT 34.4* 27.3* 29.2* 32.7*  MCV 110.3* 109.6* 107.7* 107.9*  PLT 37* 27* 27* 27*    Basic Metabolic Panel: Recent Labs  Lab 01/09/20 0857 01/10/20 0423 01/11/20 0637  NA 136 136 136  K 5.1 4.0 4.1  CL 105 107 106  CO2 20* 20* 19*  GLUCOSE 73 84 96  BUN 16 17 11   CREATININE 0.99 0.91 0.87  CALCIUM 9.0 8.8* 8.8*   GFR: Estimated Creatinine Clearance: 62 mL/min (by C-G formula based on SCr of 0.87 mg/dL). Recent Labs  Lab 01/09/20 0857 01/10/20 0423 01/10/20 1449 01/11/20 0637  WBC 5.3 3.7* 5.1 6.1    Liver Function Tests: Recent Labs  Lab 01/09/20 0857 01/10/20 0423 01/10/20 0426 01/11/20 0637  AST 118* 70*  --  92*  ALT 52* 41  --  52*  ALKPHOS 225* 159*  --  197*  BILITOT 9.8* 8.7*  --  10.2*  PROT 5.4* 4.6* 4.8* 5.2*  ALBUMIN 2.8* 2.8*  --  2.8*   No results for input(s): LIPASE, AMYLASE in the last 168 hours. No results for input(s): AMMONIA in the last 168 hours.  ABG No results found for: PHART, PCO2ART, PO2ART, HCO3, TCO2, ACIDBASEDEF, O2SAT   Coagulation Profile: Recent Labs  Lab 01/09/20 0857 01/10/20 0423  INR 1.3* 1.5*    Cardiac Enzymes: No results for input(s): CKTOTAL, CKMB, CKMBINDEX, TROPONINI in the last 168 hours.  HbA1C: No results found for: HGBA1C  CBG: No results for input(s): GLUCAP in the last 168 hours.  Rush Farmer, M.D. [facility] Pulmonary/Critical Care Medicine.

## 2020-01-11 NOTE — Procedures (Signed)
Thoracentesis Procedure Note  Pre-operative Diagnosis: Left sided pleural effusion  Post-operative Diagnosis: same  Indications: Same  Procedure Details  Consent: Informed consent was obtained. Risks of the procedure were discussed including: infection, bleeding, pain, pneumothorax.  Under sterile conditions the patient was positioned. Betadine solution and sterile drapes were utilized.  1% buffered lidocaine was used to anesthetize the 6 rib space. Fluid was obtained without any difficulties and minimal blood loss.  A dressing was applied to the wound and wound care instructions were provided.   Findings 1500 ml of clear pleural fluid was obtained. A sample was sent to Pathology for cytogenetics, flow, and cell counts, as well as for infection analysis.  Complications:  None; patient tolerated the procedure well.          Condition: stable  Plan A follow up chest x-ray was ordered. Bed Rest for 0 hours. Tylenol 650 mg. for pain.  Attending Attestation: I performed the procedure.  Rush Farmer, M.D. [facility] Pulmonary/Critical Care Medicine.

## 2020-01-12 LAB — CBC WITH DIFFERENTIAL/PLATELET
Abs Immature Granulocytes: 0.03 10*3/uL (ref 0.00–0.07)
Basophils Absolute: 0 10*3/uL (ref 0.0–0.1)
Basophils Relative: 1 %
Eosinophils Absolute: 0.6 10*3/uL — ABNORMAL HIGH (ref 0.0–0.5)
Eosinophils Relative: 8 %
HCT: 30.3 % — ABNORMAL LOW (ref 36.0–46.0)
Hemoglobin: 10 g/dL — ABNORMAL LOW (ref 12.0–15.0)
Immature Granulocytes: 0 %
Lymphocytes Relative: 8 %
Lymphs Abs: 0.6 10*3/uL — ABNORMAL LOW (ref 0.7–4.0)
MCH: 35.3 pg — ABNORMAL HIGH (ref 26.0–34.0)
MCHC: 33 g/dL (ref 30.0–36.0)
MCV: 107.1 fL — ABNORMAL HIGH (ref 80.0–100.0)
Monocytes Absolute: 0.6 10*3/uL (ref 0.1–1.0)
Monocytes Relative: 8 %
Neutro Abs: 5.3 10*3/uL (ref 1.7–7.7)
Neutrophils Relative %: 75 %
Platelets: 27 10*3/uL — CL (ref 150–400)
RBC: 2.83 MIL/uL — ABNORMAL LOW (ref 3.87–5.11)
RDW: 15.9 % — ABNORMAL HIGH (ref 11.5–15.5)
WBC: 7.1 10*3/uL (ref 4.0–10.5)
nRBC: 0 % (ref 0.0–0.2)

## 2020-01-12 LAB — COMPREHENSIVE METABOLIC PANEL
ALT: 48 U/L — ABNORMAL HIGH (ref 0–44)
AST: 78 U/L — ABNORMAL HIGH (ref 15–41)
Albumin: 2.5 g/dL — ABNORMAL LOW (ref 3.5–5.0)
Alkaline Phosphatase: 187 U/L — ABNORMAL HIGH (ref 38–126)
Anion gap: 9 (ref 5–15)
BUN: 14 mg/dL (ref 6–20)
CO2: 22 mmol/L (ref 22–32)
Calcium: 8.7 mg/dL — ABNORMAL LOW (ref 8.9–10.3)
Chloride: 102 mmol/L (ref 98–111)
Creatinine, Ser: 0.82 mg/dL (ref 0.44–1.00)
GFR calc Af Amer: >60 mL/min (ref >60)
GFR calc non Af Amer: >60 mL/min (ref >60)
Glucose, Bld: 114 mg/dL — ABNORMAL HIGH (ref 70–99)
Potassium: 3.8 mmol/L (ref 3.5–5.1)
Sodium: 133 mmol/L — ABNORMAL LOW (ref 135–145)
Total Bilirubin: 9.4 mg/dL — ABNORMAL HIGH (ref 0.3–1.2)
Total Protein: 4.7 g/dL — ABNORMAL LOW (ref 6.5–8.1)

## 2020-01-12 LAB — PROTIME-INR
INR: 1.6 — ABNORMAL HIGH (ref 0.8–1.2)
Prothrombin Time: 18.9 seconds — ABNORMAL HIGH (ref 11.4–15.2)

## 2020-01-12 MED ORDER — SIMETHICONE 80 MG PO CHEW
80.0000 mg | CHEWABLE_TABLET | Freq: Once | ORAL | Status: AC
  Administered 2020-01-12: 80 mg via ORAL
  Filled 2020-01-12: qty 1

## 2020-01-12 NOTE — Progress Notes (Signed)
PROGRESS NOTE    Hannah Donovan  [id number] DOB: [birth year] DOA: 01/09/2020 PCP: Patient, No Pcp Per   Brief Narrative: 44 year old female with history of liver cirrhosis due to [place] (reports previous liver biopsy in [place]), with cholestasis of pregnancy in 1999, being followed by hepatology clinic at [facility], previous history of thoracentesis and  LV paracentesis, history of SBP/spontaneous bacterial empyema, on chronic prophylactic Bactrim, history of hepatic encephalopathy, thrombocytopenia/hypersplenism, severe protein calorie malnutrition, frequent hospitalization, 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  Patient was found to have symptomatic large pleural effusion underwent 1.5 L thoracentesis after platelet transfusion by critical care.  She was admitted for further management  1/22 - Underwent thoracentesis of right side 1/23 - Underwent thoracentesis of left side again due to rapid accumulation of fluid.  Subjective: Patient reports that she feels much improved from a respiratory standpoint after her thoracentesis yesterday evening. She does report urinary urgency with only a small amount of UOP early this morning. She also reports mild increase in abdominal distension. Bladder scan after void of only 34 ml. Labs stable this morning with creatinine at 0.82, plt at 27 from 27 yesterday and hgb 10.0. She denies any CP, N/V/D, fever, chills.    Objective: Vitals:   01/11/20 0841 01/11/20 0900 01/11/20 1000 01/11/20 1100  BP: 110/64     Pulse: (!) 102     Resp: (!) 24     Temp:      TempSrc:      SpO2: 95% 94% 95% 93%  Weight:      Height:        Intake/Output Summary (Last 24 hours) at 01/11/2020 1245 Last data filed at 01/11/2020 0800 Gross per 24 hour  Intake 440 ml  Output 350 ml  Net 90 ml    Filed Weights   01/10/20 0739  Weight: 47.1 kg   Weight change:   Body mass index is 19.62 kg/m.  Intake/Output from previous day: 01/22 0701 - 01/23 0700 In: 600 [P.O.:600] Out: 350 [Urine:350] Intake/Output this shift: Total I/O In: 200 [P.O.:200] Out: -   Examination:  General exam: AAOx3, older than stated age, thin, not in distress, weak looking. HEENT:Oral mucosa moist, Ear/Nose WNL grossly, dentition normal. Respiratory system: Decreased breath sounds bilaterally, no wheezing or crackles,no use of accessory muscle, mild tachypnea Cardiovascular system: S1 & S2 +, No JVD,. Gastrointestinal system: Abdomen soft, NT, mildly distended , BS+ Nervous System:Alert, awake, moving extremities and grossly nonfocal Extremities: No edema, distal peripheral pulses palpable.  Skin: No rashes,no icterus. 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  Meld- NA 20 on admission.  Status post paracentesis 1 L and thoracentesis 1.5 L.  No evidence of infection on fluid analysis.  Appreciate GI and pulmonary input.  Continue her home midodrine, Lasix, Aldactone, lactulose, prophylactic Bactrim. Monitoring renal function closely particularly given patient report of decreased UOP although this does not appear to be accurately documented. Will order strict I/Os. CBC, CMP, INR in AM  Symptomatic bilateral pleural effusion: s/p 1.5 thoracentesis 1/21: Total nucleated cell counts 26,80, neutrophil 10,7.  Gram stain no organism no WBC, culture pending. Right thoracentesis on 1/22-1 L of straw-colored pleural fluid was removed.  X-ray postprocedure  shows tiny residual right hydropneumothorax with decreased pleural effusion, moderate to large left pleural effusion significantly increased. PCCM with repeat thoracentesis  on left on 1/23. Patient likely to require TIPS versus pleurx given the rapidity of re-collection  history of SBP/spontaneous bacterial empyema: Negative for SBP. Continue home prophylactic Bactrim.  Hx of hepatic encephalopathy: Continue her home lactulose, monitor clinically with neuro checks.    Thrombocytopenia/hypersplenism: Monitor platelet count.   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  Anemia of chronic disease due to cirrhosis/Jehovah's Witness: Patient stated in the future if she needs blood transfusion she will think about it.  Leukopenia again suspecting hypersplenism monitor  Lack of insurance/undocumented: This is further causing problem in her management due to limited resources/follow-ups.  TOC consulted.  Patient believes after obtaining insurance she will be able to go on the transplant list at [facility]  Body mass index is 18.29 kg/m.    DVT prophylaxis: SCD, no AC due to thrombocytopenia Code Status: Full code Family Communication: plan of care discussed with patient at bedside. Disposition Plan: Remains inpatient pending clinical improvement. Likely to require TIPS versus pleurx catheter given her recurrent issues with pleural effusions secondary to elevated portal pressures.   Consultants: PCCM, GI Procedures:see note Microbiology: Ascitic fluid pleural fluid culture pending Antimicrobials: Anti-infectives (From admission, onward)   Start     Dose/Rate Route Frequency Ordered Stop   01/09/20 1145  sulfamethoxazole-trimethoprim (BACTRIM DS) 800-160 MG per tablet 1 tablet     1 tablet Oral Daily 01/09/20 1135        Medications: Scheduled Meds: . sodium chloride   Intravenous Once  . cinacalcet  30 mg Oral Q M,W,F  . furosemide  60 mg Oral Daily  . lactulose  20 g Oral TID  . midodrine  15 mg Oral TID WC    . pantoprazole  40 mg Oral Daily  . sodium chloride flush  3 mL Intravenous Q12H  . spironolactone  150 mg Oral Daily  . sulfamethoxazole-trimethoprim  1 tablet Oral Daily  . ursodiol  300 mg Oral BID   Continuous Infusions:  Data Reviewed: I have personally reviewed following labs and imaging studies  CBC: Recent Labs  Lab 01/09/20 0857 01/10/20 0423 01/10/20 1449 01/11/20 0637 01/12/20 0456  WBC 5.3 3.7* 5.1 6.1 7.1  NEUTROABS 3.8  --   --   --  5.3  HGB 11.0* 8.9* 9.5* 10.7* 10.0*  HCT 34.4* 27.3* 29.2* 32.7* 30.3*  MCV 110.3* 109.6* 107.7* 107.9* 107.1*  PLT 37* 27* 27* 27* 27*   Basic Metabolic Panel: Recent Labs  Lab 01/09/20 0857 01/10/20 0423 01/11/20 0637 01/12/20 0456  NA 136 136 136 133*  K 5.1 4.0 4.1 3.8  CL 105 107 106 102  CO2 20* 20* 19* 22  GLUCOSE 73 84 96 114*  BUN 16 17 11 14   CREATININE 0.99 0.91 0.87 0.82  CALCIUM 9.0 8.8* 8.8* 8.7*   GFR: Estimated Creatinine Clearance: 61.3 mL/min (by C-G formula based on SCr of 0.82 mg/dL). Liver Function Tests: Recent Labs  Lab 01/09/20 0857 01/10/20 0423 01/10/20 0426 01/11/20 0637 01/12/20 0456  AST 118* 70*  --  92* 78*  ALT 52* 41  --  52* 48*  ALKPHOS 225* 159*  --  197* 187*  BILITOT 9.8* 8.7*  --  10.2* 9.4*  PROT 5.4* 4.6* 4.8* 5.2* 4.7*  ALBUMIN 2.8* 2.8*  --  2.8* 2.5*   No results for input(s): LIPASE, AMYLASE in the last 168 hours. No results for input(s): AMMONIA in the last 168 hours. Coagulation Profile: Recent Labs  Lab 01/09/20 0857 01/10/20 0423 01/12/20 0456  INR 1.3* 1.5* 1.6*   Cardiac Enzymes: No results for input(s): CKTOTAL, CKMB, CKMBINDEX, TROPONINI in the last 168 hours. BNP (last 3 results) No results for input(s): PROBNP in the last 8760 hours. HbA1C: No results for input(s): HGBA1C in the last 72 hours. CBG: No results for input(s): GLUCAP in the last 168 hours. Lipid Profile: No results for input(s): CHOL, HDL, LDLCALC, TRIG, CHOLHDL, LDLDIRECT in  the last 72 hours. Thyroid Function Tests: No results for input(s): TSH, T4TOTAL, FREET4, T3FREE, THYROIDAB in the last 72 hours. Anemia Panel: No results for input(s): VITAMINB12, FOLATE, FERRITIN, TIBC, IRON, RETICCTPCT in the last 72 hours. Sepsis Labs: No results for input(s): PROCALCITON, LATICACIDVEN in the last 168 hours.  Recent Results (from the past 240 hour(s))  Respiratory Panel by RT PCR (Flu A&B, Covid) - Nasopharyngeal Swab     Status: None   Collection Time: 01/09/20 10:33 AM   Specimen: Nasopharyngeal Swab  Result Value Ref Range Status   SARS Coronavirus 2 by RT PCR NEGATIVE NEGATIVE Final    Comment: (NOTE) SARS-CoV-2 target nucleic acids are NOT DETECTED. The SARS-CoV-2 RNA is generally detectable in upper respiratoy specimens during the acute phase of infection. The lowest concentration of SARS-CoV-2 viral copies this assay can detect is 131 copies/mL. A negative result does not preclude SARS-Cov-2 infection and should not be used as the sole basis for treatment or other patient management decisions. A negative result may occur with  improper specimen collection/handling, submission of specimen other than nasopharyngeal swab, presence of viral mutation(s) within the areas targeted by this assay, and inadequate number of viral copies (<131 copies/mL). A negative result must be combined with clinical observations, patient history, and epidemiological information. The expected result is Negative. Fact Sheet for Patients:  PinkCheek.be Fact Sheet for Healthcare Providers:  GravelBags.it This test is not yet ap proved or cleared by the [place] FDA and  has been authorized for detection and/or diagnosis of SARS-CoV-2 by FDA under an Emergency Use Authorization (EUA). This EUA will remain  in effect (meaning this test can be used) for the duration of the COVID-19 declaration under Section 564(b)(1) of the  Act, 21 U.S.C. section 360bbb-3(b)(1), unless the authorization is terminated or revoked sooner.    Influenza A by PCR NEGATIVE NEGATIVE Final   Influenza B by PCR NEGATIVE NEGATIVE Final    Comment: (NOTE) The Xpert Xpress SARS-CoV-2/FLU/RSV assay is intended as an aid in  the diagnosis of influenza from Nasopharyngeal swab specimens and  should not be used as a sole basis for treatment. Nasal washings and  aspirates are unacceptable for Xpert Xpress SARS-CoV-2/FLU/RSV  testing. Fact Sheet for Patients: PinkCheek.be Fact Sheet for Healthcare Providers: GravelBags.it This test is not yet approved or cleared by the [place] FDA and  has been authorized for detection and/or diagnosis of SARS-CoV-2 by  FDA under an Emergency Use Authorization (EUA). This EUA will remain  in effect (meaning this test can be used) for the duration of the  Covid-19 declaration under Section 564(b)(1) of the Act, 21  U.S.C. section 360bbb-3(b)(1), unless the authorization is  terminated or revoked. Performed at [facility], [place] [address] [place], [place] [address]   Culture, body fluid-bottle     Status: None (Preliminary result)  Collection Time: 01/09/20  4:24 PM   Specimen: Fluid  Result Value Ref Range Status   Specimen Description FLUID PLEURAL LEFT  Final   Special Requests BOTTLES DRAWN AEROBIC ONLY  Final   Culture   Final    NO GROWTH 3 DAYS Performed at [facility], [place] [address], [place], [place] [address]    Report Status PENDING  Incomplete  Gram stain     Status: None   Collection Time: 01/09/20  4:24 PM   Specimen: Fluid  Result Value Ref Range Status   Specimen Description FLUID PLEURAL LEFT  Final   Special Requests NONE  Final   Gram Stain   Final    NO WBC SEEN NO ORGANISMS SEEN Performed at [facility], [address] [place], [place] [address]    Report Status 01/10/2020 FINAL  Final    Culture, body fluid-bottle     Status: None (Preliminary result)   Collection Time: 01/09/20  4:24 PM   Specimen: Fluid  Result Value Ref Range Status   Specimen Description FLUID PERITONEAL  Final   Special Requests BOTTLES DRAWN AEROBIC ONLY  Final   Culture   Final    NO GROWTH 3 DAYS Performed at [facility], [place] [address] [place], [place] [address]    Report Status PENDING  Incomplete  Gram stain     Status: None   Collection Time: 01/09/20  4:24 PM   Specimen: Fluid  Result Value Ref Range Status   Specimen Description FLUID PERITONEAL  Final   Special Requests NONE  Final   Gram Stain   Final    NO WBC SEEN NO ORGANISMS SEEN Performed at [facility], [address], [place], [place] [address]    Report Status 01/10/2020 FINAL  Final  Body fluid culture     Status: None (Preliminary result)   Collection Time: 01/10/20  9:43 AM   Specimen: Pleura; Body Fluid  Result Value Ref Range Status   Specimen Description PLEURAL RIGHT  Final   Special Requests NONE  Final   Gram Stain NO WBC SEEN NO ORGANISMS SEEN   Final   Culture   Final    NO GROWTH 2 DAYS Performed at [facility], [address] [place], [place] [address]    Report Status PENDING  Incomplete      Radiology Studies: DG CHEST PORT 1 VIEW  Result Date: 01/11/2020 CLINICAL DATA:  Shortness of breath and abdominal pain. EXAM: PORTABLE CHEST 1 VIEW COMPARISON:  January 11, 2020 FINDINGS: Moderate to marked severity atelectasis and/or infiltrate is seen within the left lung base. There is a moderate size left pleural effusion. This is markedly decreased in size when compared to the prior exam. No pneumothorax is identified. The heart size and mediastinal contours are within normal limits. The visualized skeletal structures are unremarkable. IMPRESSION: 1. Moderate to marked severity left basilar atelectasis and/or infiltrate. 2. Moderate size left pleural effusion, decreased in severity  when compared to the prior study dated January 11, 2020. Electronically Signed   By: Virgina Norfolk M.D.   On: 01/11/2020 16:22   DG CHEST PORT 1 VIEW  Result Date: 01/11/2020 CLINICAL DATA:  Pleural effusion EXAM: PORTABLE CHEST 1 VIEW COMPARISON:  01/10/2020 FINDINGS: Progression of left effusion with complete opacification left hemithorax and collapse of the left lung. Small right effusion and right lower lobe airspace disease unchanged. IMPRESSION: Progression of large left effusion which  now occupies entire left hemithorax and collapse of the left lung. Recommend thoracentesis. Right lower lobe airspace disease and right effusion unchanged. Electronically Signed   By: Franchot Gallo M.D.   On: 01/11/2020 09:03   DG CHEST PORT 1 VIEW  Result Date: 01/10/2020 CLINICAL DATA:  Pleural effusion, had thoracentesis on 01/09/2020 EXAM: PORTABLE CHEST 1 VIEW COMPARISON:  Portable exam 1628 hours compared to 1006 hours FINDINGS: Stable heart size and mediastinal contours. Small RIGHT pleural effusion increased from previous exam. Very large LEFT pleural effusion increased from previous exam. Significant atelectasis of entirety of LEFT lung and of RIGHT lung base. No pneumothorax or acute osseous findings. IMPRESSION: Very large LEFT and small LEFT pleural effusions increased since earlier study. Electronically Signed   By: Lavonia Dana M.D.   On: 01/10/2020 16:46      LOS: 3 days    Arlan Organ, MD [facility]  01/12/2020, 1:20 PM

## 2020-01-12 NOTE — Progress Notes (Signed)
Pt was feeling the urge to void and could only void 188mL. Pt was bladder scanned and only 84mL showed up. Pt was okay with this and stated she was tired and going to try to go back to sleep. Will continue to monitor.   Eleanora Neighbor, RN

## 2020-01-12 NOTE — Progress Notes (Signed)
Cross cover [facility] Subjective: Since I last evaluated the patient, her overall situation has not changed much.  She continues to have some shortness of breath and some abdominal bloating which she claims is worse after she takes lactulose. She is very anxious about her upcoming appointment at [facility] and is not sure as to what will be decided for her.  I think she has been advised about TIPS before but she is not sure about any concrete plan plans in this regard.  She had some relief of her chest discomfort and shortness of breath after thoracentesis yesterday done on the left side which was received by thoracentesis on the right side on 01/10/2020. 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, reportedly diagnosed by liver biopsy while she lived in [place] with a MELD score of 20, requiring large volume paracentesis on Lasix Aldactone and midodrine, bleeding esophageal varices chronic SBP on Bactrim, hepatic encephalopathy on lactulose, hepatic hydrothorax,  thrombocytopenia and hypersplenism, history of SBP on chronic Bactrim,   Objective: Vital signs in last 24 hours: Temp:  [97.9 F (36.6 C)-99.9 F (37.7 C)] 99.9 F (37.7 C) (01/24 0500) Pulse Rate:  [92-102] 92 (01/23 1848) Resp:  [16-24] 16 (01/24 0500) BP: (105-113)/(59-77) 105/59 (01/24 0500) SpO2:  [93 %-99 %] 99 % (01/24 0500) Last BM Date: 01/10/20  Intake/Output from previous day: 01/23 0701 - 01/24 0700 In: 1150 [P.O.:1150] Out: 100 [Urine:100] Intake/Output this shift: No intake/output data recorded.  General appearance: alert, cooperative, appears stated age, fatigued, icteric and mild distress Resp: diminished breath sounds bilaterally Cardio: regular rate and rhythm, S1, S2 normal, no murmur, click, rub or gallop GI: soft, non-tender; bowel sounds normal; no masses,  no organomegaly Extremities: extremities normal, atraumatic, no cyanosis or edema  Lab Results: Recent Labs   01/10/20 1449 01/11/20 0637 01/12/20 0456  WBC 5.1 6.1 7.1  HGB 9.5* 10.7* 10.0*  HCT 29.2* 32.7* 30.3*  PLT 27* 27* 27*   BMET Recent Labs    01/10/20 0423 01/11/20 0637 01/12/20 0456  NA 136 136 133*  K 4.0 4.1 3.8  CL 107 106 102  CO2 20* 19* 22  GLUCOSE 84 96 114*  BUN 17 11 14   CREATININE 0.91 0.87 0.82  CALCIUM 8.8* 8.8* 8.7*   LFT Recent Labs    01/12/20 0456  PROT 4.7*  ALBUMIN 2.5*  AST 78*  ALT 48*  ALKPHOS 187*  BILITOT 9.4*   PT/INR Recent Labs    01/10/20 0423 01/12/20 0456  LABPROT 18.3* 18.9*  INR 1.5* 1.6*   Studies/Results: DG CHEST PORT 1 VIEW  Result Date: 01/11/2020 CLINICAL DATA:  Shortness of breath and abdominal pain. EXAM: PORTABLE CHEST 1 VIEW COMPARISON:  January 11, 2020 FINDINGS: Moderate to marked severity atelectasis and/or infiltrate is seen within the left lung base. There is a moderate size left pleural effusion. This is markedly decreased in size when compared to the prior exam. No pneumothorax is identified. The heart size and mediastinal contours are within normal limits. The visualized skeletal structures are unremarkable. IMPRESSION: 1. Moderate to marked severity left basilar atelectasis and/or infiltrate. 2. Moderate size left pleural effusion, decreased in severity when compared to the prior study dated January 11, 2020. Electronically Signed   By: Virgina Norfolk M.D.   On: 01/11/2020 16:22   DG CHEST PORT 1 VIEW  Result Date: 01/11/2020 CLINICAL DATA:  Pleural effusion EXAM: PORTABLE CHEST 1 VIEW COMPARISON:  01/10/2020 FINDINGS: Progression of left  effusion with complete opacification left hemithorax and collapse of the left lung. Small right effusion and right lower lobe airspace disease unchanged. IMPRESSION: Progression of large left effusion which now occupies entire left hemithorax and collapse of the left lung. Recommend thoracentesis. Right lower lobe airspace disease and right effusion unchanged. Electronically  Signed   By: Franchot Gallo M.D.   On: 01/11/2020 09:03   DG CHEST PORT 1 VIEW  Result Date: 01/10/2020 CLINICAL DATA:  Pleural effusion, had thoracentesis on 01/09/2020 EXAM: PORTABLE CHEST 1 VIEW COMPARISON:  Portable exam 1628 hours compared to 1006 hours FINDINGS: Stable heart size and mediastinal contours. Small RIGHT pleural effusion increased from previous exam. Very large LEFT pleural effusion increased from previous exam. Significant atelectasis of entirety of LEFT lung and of RIGHT lung base. No pneumothorax or acute osseous findings. IMPRESSION: Very large LEFT and small LEFT pleural effusions increased since earlier study. Electronically Signed   By: Lavonia Dana M.D.   On: 01/10/2020 16:46   DG CHEST PORT 1 VIEW  Result Date: 01/10/2020 CLINICAL DATA:  Status post thoracentesis EXAM: PORTABLE CHEST 1 VIEW COMPARISON:  Chest radiograph from one day prior. FINDINGS: Stable cardiomediastinal silhouette with normal heart size. Tiny residual right hydropneumothorax at the right costophrenic angle, with the pleural effusion component decreased. Moderate to large left pleural effusion, significantly increased. No pulmonary edema. Residual hazy patchy right lung base opacity. Worsening aeration at the left lung base. IMPRESSION: 1. Tiny residual right hydropneumothorax at the right costophrenic angle, with decreased pleural effusion component. 2. Moderate to large left pleural effusion, significantly increased. 3. Hazy residual right lung base opacity and worsening left lung base opacity, favor atelectasis. These results will be called to the ordering clinician or representative by the Radiologist Assistant, and communication documented in the PACS or zVision Dashboard. Electronically Signed   By: Ilona Sorrel M.D.   On: 01/10/2020 10:24   Medications: I have reviewed the patient's current medications.  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 Hepatic encephalopathy requiring Lactulose and Rifaximin. Patient has a follow-up at [facility] next week; her work-up has been somewhat limited due to lack of insurance and funds. Further recommendation to be made by the hepatology department at [facility] we will continue supportive care here for now along with ursodiol for her PBC and daily Bactrim for SBP prophylaxis. 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      LOS: 3 days   Juanita Craver 01/12/2020, 8:20 AM

## 2020-01-12 NOTE — Progress Notes (Signed)
Pt c/o gas. RN messaged NP on call to see if we can have an order for Simethicone. Awaiting response.   Eleanora Neighbor, RN

## 2020-01-13 ENCOUNTER — Inpatient Hospital Stay (HOSPITAL_COMMUNITY): Payer: Medicaid Other

## 2020-01-13 DIAGNOSIS — J918 Pleural effusion in other conditions classified elsewhere: Secondary | ICD-10-CM

## 2020-01-13 DIAGNOSIS — K769 Liver disease, unspecified: Secondary | ICD-10-CM

## 2020-01-13 LAB — CBC WITH DIFFERENTIAL/PLATELET
Abs Immature Granulocytes: 0.03 10*3/uL (ref 0.00–0.07)
Basophils Absolute: 0.1 10*3/uL (ref 0.0–0.1)
Basophils Relative: 1 %
Eosinophils Absolute: 0.8 10*3/uL — ABNORMAL HIGH (ref 0.0–0.5)
Eosinophils Relative: 13 %
HCT: 32.5 % — ABNORMAL LOW (ref 36.0–46.0)
Hemoglobin: 10.8 g/dL — ABNORMAL LOW (ref 12.0–15.0)
Immature Granulocytes: 1 %
Lymphocytes Relative: 12 %
Lymphs Abs: 0.7 10*3/uL (ref 0.7–4.0)
MCH: 35.4 pg — ABNORMAL HIGH (ref 26.0–34.0)
MCHC: 33.2 g/dL (ref 30.0–36.0)
MCV: 106.6 fL — ABNORMAL HIGH (ref 80.0–100.0)
Monocytes Absolute: 0.6 10*3/uL (ref 0.1–1.0)
Monocytes Relative: 10 %
Neutro Abs: 4.1 10*3/uL (ref 1.7–7.7)
Neutrophils Relative %: 63 %
Platelets: 36 10*3/uL — ABNORMAL LOW (ref 150–400)
RBC: 3.05 MIL/uL — ABNORMAL LOW (ref 3.87–5.11)
RDW: 16 % — ABNORMAL HIGH (ref 11.5–15.5)
WBC: 6.3 10*3/uL (ref 4.0–10.5)
nRBC: 0 % (ref 0.0–0.2)

## 2020-01-13 LAB — PATHOLOGIST SMEAR REVIEW

## 2020-01-13 LAB — COMPREHENSIVE METABOLIC PANEL
ALT: 53 U/L — ABNORMAL HIGH (ref 0–44)
AST: 89 U/L — ABNORMAL HIGH (ref 15–41)
Albumin: 2.7 g/dL — ABNORMAL LOW (ref 3.5–5.0)
Alkaline Phosphatase: 206 U/L — ABNORMAL HIGH (ref 38–126)
Anion gap: 11 (ref 5–15)
BUN: 13 mg/dL (ref 6–20)
CO2: 21 mmol/L — ABNORMAL LOW (ref 22–32)
Calcium: 9.2 mg/dL (ref 8.9–10.3)
Chloride: 104 mmol/L (ref 98–111)
Creatinine, Ser: 1.09 mg/dL — ABNORMAL HIGH (ref 0.44–1.00)
GFR calc Af Amer: >60 mL/min (ref >60)
GFR calc non Af Amer: >60 mL/min (ref >60)
Glucose, Bld: 94 mg/dL (ref 70–99)
Potassium: 4.2 mmol/L (ref 3.5–5.1)
Sodium: 136 mmol/L (ref 135–145)
Total Bilirubin: 8.9 mg/dL — ABNORMAL HIGH (ref 0.3–1.2)
Total Protein: 5.3 g/dL — ABNORMAL LOW (ref 6.5–8.1)

## 2020-01-13 LAB — BODY FLUID CULTURE
Culture: NO GROWTH
Gram Stain: NONE SEEN

## 2020-01-13 LAB — PROTIME-INR
INR: 1.4 — ABNORMAL HIGH (ref 0.8–1.2)
Prothrombin Time: 17.1 seconds — ABNORMAL HIGH (ref 11.4–15.2)

## 2020-01-13 LAB — CYTOLOGY - NON PAP

## 2020-01-13 MED ORDER — SODIUM CHLORIDE 0.9% IV SOLUTION: Freq: Once | INTRAVENOUS | Status: AC

## 2020-01-13 NOTE — Progress Notes (Signed)
PCCM Interval Progress Note  I updated patient on my conversation with her hepatologist at [facility], Dr. Drue Novel. Unfortunately, she is not a candidate for transplant and no longer a TIPS candidate due to her hyperbilirubinemia. We discussed palliative options and hospice for her incurable condition. Understandably, Hannah Donovan was upset and emotional with this news and stated she "cannot give up." We discussed options to manage her symptoms including pleurx catheter placement for hepatic hydrothorax to help facilitate her goal of going home. I encouraged her to discuss this with her husband and to focus on symptom based management at this point.   Plan Continue [place] Palliative team consulted Will arrange PleurX cathether pending insurance approval. Appreciate case management involvement  Hannah Donovan, M.D. [facility] Pulmonary/Critical Care Medicine 01/13/2020 6:02 PM

## 2020-01-13 NOTE — Progress Notes (Signed)
Pt just had a chest x-ray that shows the whole left side completely white on the image. RN messaged NP on call to make aware. Pt sating at 95 percent on 4LPM. Pt does not appear to be in any distress. Will continue to monitor. Awaiting response.   Eleanora Neighbor, RN

## 2020-01-13 NOTE — Procedures (Signed)
Thoracentesis Procedure Note  Pre-operative Diagnosis: Large left pleural effusion   Post-operative Diagnosis: same  Indications: Respiratory insufficiency due to large left pleural effusion   Procedure Details  Consent: Informed consent was obtained. Risks of the procedure were discussed including: infection, bleeding, pain, pneumothorax.  Under sterile conditions the patient was positioned. Betadine solution and sterile drapes were utilized.  1% plain lidocaine was used to anesthetize the left posterior chest at the appropriate rib space identified by ultrasound. Fluid was obtained without any difficulties and minimal blood loss.  A dressing was applied to the wound and wound care instructions were provided.   Findings 1.4L of clear pleural fluid was obtained. A sample was sent to Pathology for cytogenetics, flow, and cell counts, as well as for infection analysis.  Complications:  None; patient tolerated the procedure well.          Condition: stable  Plan A follow up chest x-ray was ordered. Bed Rest for 1 hours. Tylenol 650 mg. for pain.  Johnsie Cancel, NP-C [place] [facility] Contact / Pager information can be found on Amion  01/13/2020, 2:06 PM

## 2020-01-13 NOTE — TOC Initial Note (Signed)
Transition of Care [facility]) - Initial/Assessment Note    Patient Details  Name: Hannah Donovan MRN: [id number] Date of Birth: 02/25/1976  Transition of Care [facility]) CM/SW Contact:    Bartholomew Crews, RN Phone Number: [phone] 01/13/2020, 5:20 PM  Clinical Narrative:                 Spoke with patient at the bedside. States that she had been staying with her cousin while her son and daughter are in quarantine after recent travel to [place]. Patient had been living in [place] until October 2020.   States that her PCP is Dr. Alyson Ingles at [facility] clinic, and that she use the the pharmacy at the clinic.   Discussed potential need for oxygen as outpatient, and that charity could be arranged if necessary.   Spoke with Dr. Loanne Drilling about patient need for pleurx for palliative reasons stating that patient has less than 6 months life expectancy d/t her her frequent pleural effusions and decompensated liver failure stating that she is not a candidate for TIPS.   Reached out to [facility] for possible charity.   TOC following.   Expected Discharge Plan: Home w Hospice Care Barriers to Discharge: Continued Medical Work up   Patient Goals and CMS Choice Patient states their goals for this hospitalization and ongoing recovery are:: home with family CMS Medicare.gov Compare Post Acute Care list provided to:: Patient Choice offered to / list presented to : Patient  Expected Discharge Plan and Services Expected Discharge Plan: Home w Hospice Care In-house Referral: Clinical Social Work, Hospice / Palliative Care Discharge Planning Services: CM Consult   Living arrangements for the past 2 months: Single Family Home                 DME Arranged: Chest tube pluerex                    Prior Living Arrangements/Services Living arrangements for the past 2 months: Single Family Home Lives with:: Self, Adult Children, Relatives Patient language and need for interpreter reviewed:: Yes        Need  for Family Participation in Patient Care: Yes (Comment) Care giver support system in place?: Yes (comment)   Criminal Activity/Legal Involvement Pertinent to Current Situation/Hospitalization: No - Comment as needed  Activities of Daily Living Home Assistive Devices/Equipment: Walker (specify type) ADL Screening (condition at time of admission) Patient's cognitive ability adequate to safely complete daily activities?: Yes Is the patient deaf or have difficulty hearing?: No Does the patient have difficulty seeing, even when wearing glasses/contacts?: No Does the patient have difficulty concentrating, remembering, or making decisions?: No Patient able to express need for assistance with ADLs?: Yes Does the patient have difficulty dressing or bathing?: No Independently performs ADLs?: Yes (appropriate for developmental age) Does the patient have difficulty walking or climbing stairs?: Yes Weakness of Legs: Both Weakness of Arms/Hands: None  Permission Sought/Granted                  Emotional Assessment   Attitude/Demeanor/Rapport: Engaged Affect (typically observed): Accepting Orientation: : Oriented to Self, Oriented to  Time, Oriented to Place, Oriented to Situation Alcohol / Substance Use: Not Applicable Psych Involvement: No (comment)  Admission diagnosis:  LFT elevation [R79.89] Recurrent pleural effusion on left [J90] Acute dyspnea [R06.00] Cirrhosis of liver with ascites, unspecified hepatic cirrhosis type ([place]) [K74.60, R18.8] Liver failure without hepatic coma ([place]) [K72.90] Patient Active Problem List   Diagnosis Date Noted  .  Acute dyspnea   . Hypoxemia   . Pleural effusion associated with hepatic disorder   . S/P thoracentesis   . Portal hypertension ([place])   . Liver failure without hepatic coma ([place]) 01/09/2020  . Thrombocytopenia ([place]) 01/09/2020  . Recurrent pleural effusion on left 01/09/2020  . History of uterine cancer 01/09/2020  . Depression   .  Cirrhosis of liver with ascites ([place])    PCP:  Leta Speller, MD Pharmacy:   [id number] - [place], [place] [id number] [place] [id number] Phone: [phone] Fax: [phone]     Social Determinants of Health (SDOH) Interventions    Readmission Risk Interventions No flowsheet data found.

## 2020-01-13 NOTE — Progress Notes (Addendum)
NAME:  Artha Zemba, MRN:  [id number], DOB:  [birth year], LOS: 4 ADMISSION DATE:  01/09/2020, CONSULTATION DATE: 01/09/2020 REFERRING MD: Dr. Karmen Bongo, CHIEF COMPLAINT: Consult for thoracentesis and paracentesis  Brief History   44 year old female admitted 01/09/20 with two day history of dyspnea and abdominal distention. Known history of end-stage cirrhosis secondary to presumed PBC with recent paracentesis 10 days ago with 2.5L removed.  PCCM consulted for acute hypoxemic respiratory failure requiring paracentesis and thoracentesis.  She has primarily received all healthcare at [facility] after moving from [place] and at that she is actively attempting to be considered for liver transplant.   Lab work on admission relatively unremarkable apart from elevated LFTs and thrombocytopenia a platelet count of 37.  Chest x-ray with large left pleural effusion with ascites as well.   Past Medical History  Uterine cancer - treated with radiation in 2020 Thrombocytopenia  Cryptogenic cirrhosis  Depression Cholestasis of pregnancy   Significant Hospital Events   1/21 Admit, L thora with 1.5L removed, paracentesis with 1L removed 1/22 R thora with 1L removed 1/23 L thora with removal of 1.5L Consults:  PCCM  Procedures:     Significant Diagnostic Tests:  CXR 01/21 > Moderate bilateral pleural effusions with associated bibasilar opacities, which may reflect a combination of atelectasis and pneumonia.  CXR 01/13/20 > Complete opacification of left hemithorax from reaccumulation of right pleural effusion as well  Micro Data:  COVID 01/21 > negative   Antimicrobials:  Home prophylactic Bactrim   Interim history/subjective:  Reports shortness of breath and nausea this morning  Objective   Blood pressure 120/69, pulse 85, temperature 98.5 F (36.9 C), temperature source Oral, resp. rate 18, height 5\' 1"  (1.549 m), weight 46.4 kg, SpO2 98 %.        Intake/Output Summary (Last 24 hours) at  01/13/2020 0931 Last data filed at 01/13/2020 [id number] Gross per 24 hour  Intake 750 ml  Output 500 ml  Net 250 ml   Filed Weights   01/10/20 0739 01/11/20 0200 01/12/20 2200  Weight: 47.1 kg 43.9 kg 46.4 kg   Physical Exam: General: Thin, chronically ill-appearing female in mild respiratory distress, jaundiced HENT: [place], AT, OP clear, MMM Eyes: EOMI, no scleral icterus Respiratory: Absent breath sounds in left hemithorax, diminished right basilar breath sounds with crackles, mild tachypnea Cardiovascular: RRR, -M/R/G, no JVD GI: BS+, soft, nontender, non-distended, no fluid wave Extremities:-Edema,-tenderness Neuro: AAO x4, CNII-XII grossly intact Skin: Intact, no rashes or bruising Psych: Normal mood, normal affect  CXR 01/13/20 reviewed and interpreted by me. Complete opacification of left hemithorax from reaccumulation of right pleural effusion as well  Resolved Hospital Problem list     Assessment & Plan:   Acute hypoxemic respiratory failure secondary to bilateral pleural effusions in setting decompensated cryptogenic cirrhosis/PBC  S/p L thora x 2 and R thora x 1. S/p para x 1. Transudative fluid on initial analysis. Rapid accumulation post-procedures, now with bilateral pleural effusions L>R. Will plan for thoracentesis but pleural fluid will recur. - Wean supplemental = O2 >90% - Plan for thoracentesis today after platelet transfusion - Will obtain post-procedure CXR - GI consulted and recommend transfer to [facility] where her primary hepatologist manages her care. She is not transplant candidate and was previously discussing TIPs candidacy vs indwelling pleural catheter placement. - Given her limited treatment options, I strongly recommend palliative care consult  Decompensated cryptogenic Cirrhosis with esophageal varices, portal hypertension   Recurrent Hepatic Hydrothorax & Ascites Hx SBP, Spontaneous Bacterial Empyema  Primary biliary cirrhosis Patient has been receiving the  majority of her care through the [facility] health system and per review of records she has been requiring frequent moderate volume paracentesis every 2-3 weeks over the last 80yrs and several thoracentesis over the last few months. She is trying to purse liver transplant but due to lack of insurance, lack of support, and likely religious objections to blood products she has been deemed not a transplant candidate. MELD-NA score 20, 6% mortality  - Per GI / Primary MD - Continue prophylactic bactrim  - Primary team to contact [facility] for transfer  Thrombocytopenia  - Transfuse 2U platelets pre-procedure now - Follow CBC  - Monitor for bleeding   Discussed plan with GI and Hospitalist team. Primary team will coordinate transfer. Pulmonary will continue to follow.  Best practice:  Diet: Per primary  Pain/Anxiety/Delirium protocol (if indicated): PRNS VAP protocol (if indicated): N/A DVT prophylaxis: SCD, thrombocytopenic  GI prophylaxis: PPI Glucose control: Monitor  Mobility: Up with assistance Code Status: Full  Family Communication: Updated patient at bedside Disposition: Floor   Labs   CBC: Recent Labs  Lab 01/09/20 0857 01/10/20 0423 01/10/20 1449 01/11/20 0637 01/12/20 0456  WBC 5.3 3.7* 5.1 6.1 7.1  NEUTROABS 3.8  --   --   --  5.3  HGB 11.0* 8.9* 9.5* 10.7* 10.0*  HCT 34.4* 27.3* 29.2* 32.7* 30.3*  MCV 110.3* 109.6* 107.7* 107.9* 107.1*  PLT 37* 27* 27* 27* 27*    Basic Metabolic Panel: Recent Labs  Lab 01/09/20 0857 01/10/20 0423 01/11/20 0637 01/12/20 0456  NA 136 136 136 133*  K 5.1 4.0 4.1 3.8  CL 105 107 106 102  CO2 20* 20* 19* 22  GLUCOSE 73 84 96 114*  BUN 16 17 11 14   CREATININE 0.99 0.91 0.87 0.82  CALCIUM 9.0 8.8* 8.8* 8.7*   GFR: Estimated Creatinine Clearance: 64.8 mL/min (by C-G formula based on SCr of 0.82 mg/dL). Recent Labs  Lab 01/10/20 0423 01/10/20 1449 01/11/20 0637 01/12/20 0456  WBC 3.7* 5.1 6.1 7.1    Liver Function Tests: Recent  Labs  Lab 01/09/20 0857 01/10/20 0423 01/10/20 0426 01/11/20 0637 01/12/20 0456  AST 118* 70*  --  92* 78*  ALT 52* 41  --  52* 48*  ALKPHOS 225* 159*  --  197* 187*  BILITOT 9.8* 8.7*  --  10.2* 9.4*  PROT 5.4* 4.6* 4.8* 5.2* 4.7*  ALBUMIN 2.8* 2.8*  --  2.8* 2.5*   No results for input(s): LIPASE, AMYLASE in the last 168 hours. No results for input(s): AMMONIA in the last 168 hours.  ABG No results found for: PHART, PCO2ART, PO2ART, HCO3, TCO2, ACIDBASEDEF, O2SAT   Coagulation Profile: Recent Labs  Lab 01/09/20 0857 01/10/20 0423 01/12/20 0456  INR 1.3* 1.5* 1.6*    Cardiac Enzymes: No results for input(s): CKTOTAL, CKMB, CKMBINDEX, TROPONINI in the last 168 hours.  HbA1C: No results found for: HGBA1C  CBG: No results for input(s): GLUCAP in the last 168 hours.  Rodman Pickle, M.D. [facility] Pulmonary/Critical Care Medicine 01/13/2020 9:31 AM

## 2020-01-13 NOTE — Significant Event (Signed)
Rapid Response Event Note  Overview: Bedside Thoracentesis -- LT side  I was present for the procedure, HR remained normal and oxygen saturations were 88% on 4L when I arrived, I increased the oxygen to 6L, saturations improved to 96%. Informed consent was obtained, Time Out was done, and patient tolerated LT sided thoracentesis well, 1400 removed. I was able to wean the oxygen back down to 4L [place]. Patient had a mild headache - currently she did not want anything for pain, I instructed her to reach out to her primary nurse should she need some pain medication.Bedside nurse updated, post - procedure CXR ordered by PCCM.   Start Time 1300  End Time 1345  Nathan Stallworth R

## 2020-01-13 NOTE — Progress Notes (Signed)
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  Patient was found to have symptomatic large pleural effusion.  1/21 Admit, L thora with 1.5L removed, paracentesis with 1L removed 1/22 R thora with 1L removed 1/23 L thora with removal of 1.5L Patient continues to require multiple thoracentesis, per pulmonary and GI. She is followed at [facility] hepatology-at this time recommending transfer to [facility] pathology to consider further plan whether she needs Pleurx catheter or not as there were ongoing discussions about the same on outpatient basis.  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   Requiring LVP and thoracentesis multiple times. S/p paracentesis/thoracentesis, multiple times by critical care. No evidence of infection on fluid analysis. f/b GI and pulmonary, continue on lactulose, rifaximin, Aldactone and Lasix, ursodiol for PBC, cont  supportive measures as  needed paracentesis thoracentesis.  Cont prophylactic Bactrim.  Monitor electrolytes lfts,INR.  Symptomatic bilateral pleural effusion: pulm following.  Rapid accumulation likely need TIPS versus Pleurx ??- Pul/gi recs for [facility] transfer for further plan.  Pleural fluid ascitic fluid no growth so far Gram stain no organisms.  Continue diuretics as above.   cxr 1/25 am complete opacification of the left hemithorax- plan for thora today by pulm afte 1 unit plt transfusion  history of SBP/spontaneous bacterial empyema:  No evidence of infection of the pleural and ascitic fluid.  Continue prophylactic home Bactrim.    Hx of hepatic encephalopathy: Continue her home lactulose/rifaximin, monitor clinically with neuro checks.    Thrombocytopenia/hypersplenism: Monitor platelet count.  Patient transfuse platelet prior to procedure  1/21.  Low and stable. 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  Anemia of chronic disease due to cirrhosis/Jehovah's Witness.  Stable in 10 g.  Recent Labs  Lab 01/09/20 0857 01/10/20 0423 01/10/20 1449 01/11/20 0637 01/12/20 0456  HGB 11.0* 8.9* 9.5* 10.7* 10.0*  HCT 34.4* 27.3* 29.2* 32.7* 30.3*   Leukopenia again suspecting hypersplenism.  Resolved.  Lack of insurance/undocumented: This is further causing problem in her management due to limited resources/follow-ups.  TOC consulted.  Patient believes after obtaining insurance she will be able to go on the transplant list at [facility] Body mass index is 19.62 kg/m.  Discussed with [facility] hepatology Dr Drue Novel, Who knows the patient well.  States that patient is not a candidate for TIPS given her  bilirubin  is rising, not a liver transplant candidate and there were  discussion about Pleurx catheter placement-at this time and advises Pleurx catheter placement at [facility] [place] does not have bed at this time for trasnfer.I have provided her [id number] to Dr. Loanne Drilling from pulmonary and Dr Tarri Glenn from GI to discuss.Will consult palliative care.  DVT prophylaxis: SCD Code Status: Full code Family Communication: plan of care discussed with patient at bedside. Disposition Plan: Remains inpatient.  Pulmonary has advised transfer to [facility] hepatology.  I did make a call to transfer center and wait to hear from the hepatology Discussed with Pulmonary.  Consultants: PCCM, GI Procedures: CXR 01/21 > Moderate bilateral pleural effusions with associated bibasilar opacities, which may reflect a combination of atelectasis and pneumonia.  CXR 01/13/20 > Complete opacification of left hemithorax from reaccumulation of right pleural effusion as well S/P Thoracentesis and paracentesis as above  Microbiology: Ascitic fluid pleural fluid culture NGTD Antimicrobials: Anti-infectives (From admission, onward)   Start     Dose/Rate Route Frequency Ordered Stop   01/09/20 1145  sulfamethoxazole-trimethoprim (BACTRIM DS) 800-160 MG per tablet 1 tablet     1 tablet Oral Daily 01/09/20 1135        Medications: Scheduled Meds: . sodium chloride   Intravenous Once  . cinacalcet  30 mg Oral Q M,W,F  . furosemide  60 mg Oral Daily  . lactulose  20 g Oral TID  . midodrine  15 mg Oral TID WC  . pantoprazole  40 mg Oral Daily  . sodium chloride flush  3 mL Intravenous Q12H  . spironolactone  150 mg Oral Daily  . sulfamethoxazole-trimethoprim  1 tablet Oral Daily  . ursodiol  300 mg Oral BID   Continuous Infusions:  Objective: Vitals:   01/12/20 0953 01/12/20 2131 01/12/20 2200 01/13/20 0600  BP: 106/67 122/69  (!) 111/58  Pulse: 87 90  84  Resp: 16   15  Temp: 97.9 F (36.6 C) 98.6 F  (37 C)  98.4 F (36.9 C)  TempSrc: Oral Oral  Oral  SpO2: 98%   95%  Weight:   46.4 kg   Height:        Intake/Output Summary (Last 24 hours) at 01/13/2020 0848 Last data filed at 01/13/2020 [id number] Gross per 24 hour  Intake 870 ml  Output 500 ml  Net 370 ml   Filed Weights   01/10/20 0739 01/11/20 0200 01/12/20 2200  Weight: 47.1 kg 43.9 kg 46.4 kg   Weight change:   Body mass index is 19.33 kg/m.  Intake/Output from previous day: 01/24 0701 - 01/25 0700 In: 870 [P.O.:870] Out: 500 [Urine:500] Intake/Output this shift: No intake/output data recorded.  Examination:  General exam: AAOx3, ol for age, on [place]  HEENT:Oral mucosa moist, Ear/Nose WNL grossly, dentition normal. Respiratory system: Diminished on left side , clear, no use of accessory muscle Cardiovascular system: S1 & S2 +, No JVD,. Gastrointestinal system: Abdomen soft, full mildly tender, BS+ Nervous System:Alert, awake, moving extremities and grossly nonfocal Extremities: No edema, distal peripheral pulses palpable.  Skin: No rashes,no icterus. MSK: Normal muscle bulk,tone, power   Data Reviewed: I have personally reviewed following labs and imaging studies  CBC: Recent Labs  Lab 01/09/20 0857 01/10/20 0423 01/10/20 1449 01/11/20 0637 01/12/20 0456  WBC 5.3 3.7* 5.1 6.1 7.1  NEUTROABS 3.8  --   --   --  5.3  HGB 11.0* 8.9* 9.5* 10.7* 10.0*  HCT 34.4* 27.3* 29.2* 32.7* 30.3*  MCV 110.3* 109.6* 107.7* 107.9* 107.1*  PLT 37* 27*  27* 27* 27*   Basic Metabolic Panel: Recent Labs  Lab 01/09/20 0857 01/10/20 0423 01/11/20 0637 01/12/20 0456  NA 136 136 136 133*  K 5.1 4.0 4.1 3.8  CL 105 107 106 102  CO2 20* 20* 19* 22  GLUCOSE 73 84 96 114*  BUN 16 17 11 14   CREATININE 0.99 0.91 0.87 0.82  CALCIUM 9.0 8.8* 8.8* 8.7*   GFR: Estimated Creatinine Clearance: 64.8 mL/min (by C-G formula based on SCr of 0.82 mg/dL). Liver Function Tests: Recent Labs  Lab 01/09/20 0857 01/10/20 0423  01/10/20 0426 01/11/20 0637 01/12/20 0456  AST 118* 70*  --  92* 78*  ALT 52* 41  --  52* 48*  ALKPHOS 225* 159*  --  197* 187*  BILITOT 9.8* 8.7*  --  10.2* 9.4*  PROT 5.4* 4.6* 4.8* 5.2* 4.7*  ALBUMIN 2.8* 2.8*  --  2.8* 2.5*   No results for input(s): LIPASE, AMYLASE in the last 168 hours. No results for input(s): AMMONIA in the last 168 hours. Coagulation Profile: Recent Labs  Lab 01/09/20 0857 01/10/20 0423 01/12/20 0456  INR 1.3* 1.5* 1.6*   Cardiac Enzymes: No results for input(s): CKTOTAL, CKMB, CKMBINDEX, TROPONINI in the last 168 hours. BNP (last 3 results) No results for input(s): PROBNP in the last 8760 hours. HbA1C: No results for input(s): HGBA1C in the last 72 hours. CBG: No results for input(s): GLUCAP in the last 168 hours. Lipid Profile: No results for input(s): CHOL, HDL, LDLCALC, TRIG, CHOLHDL, LDLDIRECT in the last 72 hours. Thyroid Function Tests: No results for input(s): TSH, T4TOTAL, FREET4, T3FREE, THYROIDAB in the last 72 hours. Anemia Panel: No results for input(s): VITAMINB12, FOLATE, FERRITIN, TIBC, IRON, RETICCTPCT in the last 72 hours. Sepsis Labs: No results for input(s): PROCALCITON, LATICACIDVEN in the last 168 hours.  Recent Results (from the past 240 hour(s))  Respiratory Panel by RT PCR (Flu A&B, Covid) - Nasopharyngeal Swab     Status: None   Collection Time: 01/09/20 10:33 AM   Specimen: Nasopharyngeal Swab  Result Value Ref Range Status   SARS Coronavirus 2 by RT PCR NEGATIVE NEGATIVE Final    Comment: (NOTE) SARS-CoV-2 target nucleic acids are NOT DETECTED. The SARS-CoV-2 RNA is generally detectable in upper respiratoy specimens during the acute phase of infection. The lowest concentration of SARS-CoV-2 viral copies this assay can detect is 131 copies/mL. A negative result does not preclude SARS-Cov-2 infection and should not be used as the sole basis for treatment or other patient management decisions. A negative result may  occur with  improper specimen collection/handling, submission of specimen other than nasopharyngeal swab, presence of viral mutation(s) within the areas targeted by this assay, and inadequate number of viral copies (<131 copies/mL). A negative result must be combined with clinical observations, patient history, and epidemiological information. The expected result is Negative. Fact Sheet for Patients:  PinkCheek.be Fact Sheet for Healthcare Providers:  GravelBags.it This test is not yet ap proved or cleared by the [place] FDA and  has been authorized for detection and/or diagnosis of SARS-CoV-2 by FDA under an Emergency Use Authorization (EUA). This EUA will remain  in effect (meaning this test can be used) for the duration of the COVID-19 declaration under Section 564(b)(1) of the Act, 21 U.S.C. section 360bbb-3(b)(1), unless the authorization is terminated or revoked sooner.    Influenza A by PCR NEGATIVE NEGATIVE Final   Influenza B by PCR NEGATIVE NEGATIVE Final    Comment: (NOTE) The Xpert Xpress  SARS-CoV-2/FLU/RSV assay is intended as an aid in  the diagnosis of influenza from Nasopharyngeal swab specimens and  should not be used as a sole basis for treatment. Nasal washings and  aspirates are unacceptable for Xpert Xpress SARS-CoV-2/FLU/RSV  testing. Fact Sheet for Patients: PinkCheek.be Fact Sheet for Healthcare Providers: GravelBags.it This test is not yet approved or cleared by the [place] FDA and  has been authorized for detection and/or diagnosis of SARS-CoV-2 by  FDA under an Emergency Use Authorization (EUA). This EUA will remain  in effect (meaning this test can be used) for the duration of the  Covid-19 declaration under Section 564(b)(1) of the Act, 21  U.S.C. section 360bbb-3(b)(1), unless the authorization is  terminated or  revoked. Performed at [facility], [place] [address], [place], [place] [address]   Culture, body fluid-bottle     Status: None (Preliminary result)   Collection Time: 01/09/20  4:24 PM   Specimen: Fluid  Result Value Ref Range Status   Specimen Description FLUID PLEURAL LEFT  Final   Special Requests BOTTLES DRAWN AEROBIC ONLY  Final   Culture   Final    NO GROWTH 4 DAYS Performed at [facility], [address] [place], [place] [address]    Report Status PENDING  Incomplete  Gram stain     Status: None   Collection Time: 01/09/20  4:24 PM   Specimen: Fluid  Result Value Ref Range Status   Specimen Description FLUID PLEURAL LEFT  Final   Special Requests NONE  Final   Gram Stain   Final    NO WBC SEEN NO ORGANISMS SEEN Performed at [facility], [address], [place], [place] [address]    Report Status 01/10/2020 FINAL  Final  Culture, body fluid-bottle     Status: None (Preliminary result)   Collection Time: 01/09/20  4:24 PM   Specimen: Fluid  Result Value Ref Range Status   Specimen Description FLUID PERITONEAL  Final   Special Requests BOTTLES DRAWN AEROBIC ONLY  Final   Culture   Final    NO GROWTH 4 DAYS Performed at [facility], [place] [address] [place], [place] [address]    Report Status PENDING  Incomplete  Gram stain     Status: None   Collection Time: 01/09/20  4:24 PM   Specimen: Fluid  Result Value Ref Range Status   Specimen Description FLUID PERITONEAL  Final   Special Requests NONE  Final   Gram Stain   Final    NO WBC SEEN NO ORGANISMS SEEN Performed at [facility], [address], [place],  [address]    Report Status 01/10/2020 FINAL  Final  Body fluid culture     Status: None (Preliminary result)   Collection Time: 01/10/20  9:43 AM   Specimen: Pleura; Body Fluid  Result Value Ref Range Status   Specimen Description PLEURAL RIGHT  Final   Special Requests NONE  Final   Gram Stain NO WBC SEEN NO  ORGANISMS SEEN   Final   Culture   Final    NO GROWTH 2 DAYS Performed at [facility], [address], [place],  [address]    Report Status PENDING  Incomplete      Radiology Studies: DG CHEST PORT 1 VIEW  Result Date: 01/13/2020 CLINICAL DATA:  Pleural effusion EXAM: PORTABLE CHEST 1 VIEW COMPARISON:  Radiograph 01/11/2020 FINDINGS: Re-accumulation of a large right pleural effusion  now with complete opacification of the left hemithorax. Suspect at least some associated passive atelectatic changes. Furthermore, new gradient opacity in the right lung base may reflect some layering pleural effusion on the contralateral side as well. Much of the cardiac silhouette is obscured by overlying opacity. Right heart border is grossly similar to comparison. No visible pneumothorax. No acute osseous or soft tissue abnormality. IMPRESSION: Re-accumulation of a large right pleural effusion now with complete opacification of the left hemithorax. Gradient opacity in the right lung base may reflect layering right pleural fluid and/or atelectasis. Electronically Signed   By: Lovena Le M.D.   On: 01/13/2020 06:08   DG CHEST PORT 1 VIEW  Result Date: 01/11/2020 CLINICAL DATA:  Shortness of breath and abdominal pain. EXAM: PORTABLE CHEST 1 VIEW COMPARISON:  January 11, 2020 FINDINGS: Moderate to marked severity atelectasis and/or infiltrate is seen within the left lung base. There is a moderate size left pleural effusion. This is markedly decreased in size when compared to the prior exam. No pneumothorax is identified. The heart size and mediastinal contours are within normal limits. The visualized skeletal structures are unremarkable. IMPRESSION: 1. Moderate to marked severity left basilar atelectasis and/or infiltrate. 2. Moderate size left pleural effusion, decreased in severity when compared to the prior study dated January 11, 2020. Electronically Signed   By: Virgina Norfolk M.D.   On:  01/11/2020 16:22      LOS: 4 days   Time spent: More than 50% of that time was spent in counseling and/or coordination of care.  Antonieta Pert, MD [facility]  01/13/2020, 8:48 AM

## 2020-01-13 NOTE — Progress Notes (Signed)
Daily Rounding Note  01/13/2020, 9:30 AM  LOS: 4 days   SUBJECTIVE:   Chief complaint:      Patient has reaccumulated the left hydrothorax.  She satting 95% on 4 L nasal cannula oxygen.  Some dyspnea, not as bad as it was.    OBJECTIVE:         Vital signs in last 24 hours:    Temp:  [97.9 F (36.6 C)-98.6 F (37 C)] 98.5 F (36.9 C) (01/25 0928) Pulse Rate:  [84-90] 85 (01/25 0928) Resp:  [15-18] 18 (01/25 0928) BP: (106-122)/(58-69) 120/69 (01/25 0928) SpO2:  [95 %-98 %] 98 % (01/25 0928) Weight:  [46.4 kg] 46.4 kg (01/24 2200) Last BM Date: 01/10/20 Filed Weights   01/10/20 0739 01/11/20 0200 01/12/20 2200  Weight: 47.1 kg 43.9 kg 46.4 kg   General: Comfortable, pleasant, a little bit short of breath. Heart: RRR. Chest: Minor increased work of breathing. Abdomen: Soft.  Not distended.  Not tender. Extremities: No CCE. Neuro/Psych: A little bit anxious.  Oriented x3.  Intake/Output from previous day: 01/24 0701 - 01/25 0700 In: 870 [P.O.:870] Out: 500 [Urine:500]  Intake/Output this shift: No intake/output data recorded.  Lab Results: Recent Labs    01/10/20 1449 01/11/20 0637 01/12/20 0456  WBC 5.1 6.1 7.1  HGB 9.5* 10.7* 10.0*  HCT 29.2* 32.7* 30.3*  PLT 27* 27* 27*   BMET Recent Labs    01/11/20 0637 01/12/20 0456  NA 136 133*  K 4.1 3.8  CL 106 102  CO2 19* 22  GLUCOSE 96 114*  BUN 11 14  CREATININE 0.87 0.82  CALCIUM 8.8* 8.7*   LFT Recent Labs    01/11/20 0637 01/12/20 0456  PROT 5.2* 4.7*  ALBUMIN 2.8* 2.5*  AST 92* 78*  ALT 52* 48*  ALKPHOS 197* 187*  BILITOT 10.2* 9.4*   PT/INR Recent Labs    01/12/20 0456  LABPROT 18.9*  INR 1.6*   Hepatitis Panel No results for input(s): HEPBSAG, HCVAB, HEPAIGM, HEPBIGM in the last 72 hours.  Studies/Results: DG CHEST PORT 1 VIEW  Result Date: 01/13/2020 CLINICAL DATA:  Pleural effusion EXAM: PORTABLE CHEST 1 VIEW  COMPARISON:  Radiograph 01/11/2020 FINDINGS: Re-accumulation of a large right pleural effusion now with complete opacification of the left hemithorax. Suspect at least some associated passive atelectatic changes. Furthermore, new gradient opacity in the right lung base may reflect some layering pleural effusion on the contralateral side as well. Much of the cardiac silhouette is obscured by overlying opacity. Right heart border is grossly similar to comparison. No visible pneumothorax. No acute osseous or soft tissue abnormality. IMPRESSION: Re-accumulation of a large right pleural effusion now with complete opacification of the left hemithorax. Gradient opacity in the right lung base may reflect layering right pleural fluid and/or atelectasis. Electronically Signed   By: Lovena Le M.D.   On: 01/13/2020 06:08   DG CHEST PORT 1 VIEW  Result Date: 01/11/2020 CLINICAL DATA:  Shortness of breath and abdominal pain. EXAM: PORTABLE CHEST 1 VIEW COMPARISON:  January 11, 2020 FINDINGS: Moderate to marked severity atelectasis and/or infiltrate is seen within the left lung base. There is a moderate size left pleural effusion. This is markedly decreased in size when compared to the prior exam. No pneumothorax is identified. The heart size and mediastinal contours are within normal limits. The visualized skeletal structures are unremarkable. IMPRESSION: 1. Moderate to marked severity left basilar atelectasis and/or infiltrate. 2. Moderate  size left pleural effusion, decreased in severity when compared to the prior study dated January 11, 2020. Electronically Signed   By: Virgina Norfolk M.D.   On: 01/11/2020 16:22   Scheduled Meds: . sodium chloride   Intravenous Once  . cinacalcet  30 mg Oral Q M,W,F  . furosemide  60 mg Oral Daily  . lactulose  20 g Oral TID  . midodrine  15 mg Oral TID WC  . pantoprazole  40 mg Oral Daily  . sodium chloride flush  3 mL Intravenous Q12H  . spironolactone  150 mg Oral Daily    . sulfamethoxazole-trimethoprim  1 tablet Oral Daily  . ursodiol  300 mg Oral BID   Continuous Infusions: PRN Meds:.hyoscyamine, ondansetron **OR** ondansetron (ZOFRAN) IV   ASSESMENT:   *   PBC.  On ursodiol. Had been followed in [place] but more recently in [place] after moved to [place].  Has upcoming appointment with hepatology at [facility] with discussion regarding TIPS. So far not a candidate for liver transplant as she lacks healthcare insurance and funding.  Also her Jehovah's Witness status may be playing a role in her transplantation status.  *   Severe thrombocytopenia.  Hx hypersplenism.  Required and accepted platelet transfusion prior to thoracentesis this admission. Platelets stable at 27K, despite platelets.  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   Chronic Aldactone and Lasix increased during current hospitalization. Has required frequent thoracentesis in recent months.  Latest was 1/22 on right with 1 liter removed; 1/23 on left, 1.5 L removed.  No evidence of infection in the fluid. Hepatology at [facility] had considered patient too risky for TIPS but she did have an appointment coming up in early February to reconsider TIPS. Per this morning's CXR she has reaccumulated large left pleural effusion wit white out on that side.    *    Ascites.  History of SBP.  On prophylactic Septra.  *     Macrocytic anemia.  *    Hepatic encephalopathy.   Symptoms controlled on chronic lactulose.  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  Chronic intermittent rectal bleeding.  *     Hypoparathyroidism.  *    Severe Protein malnutrition.     PLAN   *   Given potential adverse consequences of TIPS placement.  I think this is best done if at all at [facility] so need to consider transfer.  Pleurex catheter may be an option.     *    Dr. Loanne Drilling, pulmonary, planning thoracentesis after platelet  transfusion.    Azucena Freed  01/13/2020, 9:30 AM Phone [phone]

## 2020-01-13 NOTE — Plan of Care (Signed)
  Problem: Clinical Measurements: Goal: Respiratory complications will improve Outcome: Progressing   

## 2020-01-14 ENCOUNTER — Inpatient Hospital Stay (HOSPITAL_COMMUNITY): Payer: Medicaid Other

## 2020-01-14 DIAGNOSIS — Z7189 Other specified counseling: Secondary | ICD-10-CM

## 2020-01-14 DIAGNOSIS — Z515 Encounter for palliative care: Secondary | ICD-10-CM

## 2020-01-14 LAB — PREPARE PLATELET PHERESIS
Unit division: 0
Unit division: 0

## 2020-01-14 LAB — COMPREHENSIVE METABOLIC PANEL
ALT: 53 U/L — ABNORMAL HIGH (ref 0–44)
AST: 90 U/L — ABNORMAL HIGH (ref 15–41)
Albumin: 2.6 g/dL — ABNORMAL LOW (ref 3.5–5.0)
Alkaline Phosphatase: 193 U/L — ABNORMAL HIGH (ref 38–126)
Anion gap: 11 (ref 5–15)
BUN: 12 mg/dL (ref 6–20)
CO2: 23 mmol/L (ref 22–32)
Calcium: 9.1 mg/dL (ref 8.9–10.3)
Chloride: 100 mmol/L (ref 98–111)
Creatinine, Ser: 0.94 mg/dL (ref 0.44–1.00)
GFR calc Af Amer: >60 mL/min (ref >60)
GFR calc non Af Amer: >60 mL/min (ref >60)
Glucose, Bld: 100 mg/dL — ABNORMAL HIGH (ref 70–99)
Potassium: 3.8 mmol/L (ref 3.5–5.1)
Sodium: 134 mmol/L — ABNORMAL LOW (ref 135–145)
Total Bilirubin: 9.7 mg/dL — ABNORMAL HIGH (ref 0.3–1.2)
Total Protein: 5.2 g/dL — ABNORMAL LOW (ref 6.5–8.1)

## 2020-01-14 LAB — BPAM PLATELET PHERESIS
Blood Product Expiration Date: 202101262359
Blood Product Expiration Date: 202101272359
ISSUE DATE / TIME: 202101251044
ISSUE DATE / TIME: 202101251249
Unit Type and Rh: 6200
Unit Type and Rh: 6200

## 2020-01-14 LAB — CBC
HCT: 31.3 % — ABNORMAL LOW (ref 36.0–46.0)
Hemoglobin: 10.4 g/dL — ABNORMAL LOW (ref 12.0–15.0)
MCH: 35.3 pg — ABNORMAL HIGH (ref 26.0–34.0)
MCHC: 33.2 g/dL (ref 30.0–36.0)
MCV: 106.1 fL — ABNORMAL HIGH (ref 80.0–100.0)
Platelets: 51 10*3/uL — ABNORMAL LOW (ref 150–400)
RBC: 2.95 MIL/uL — ABNORMAL LOW (ref 3.87–5.11)
RDW: 16.1 % — ABNORMAL HIGH (ref 11.5–15.5)
WBC: 6.3 10*3/uL (ref 4.0–10.5)
nRBC: 0 % (ref 0.0–0.2)

## 2020-01-14 LAB — CHOLESTEROL, BODY FLUID: Cholesterol, Fluid: 19 mg/dL

## 2020-01-14 LAB — CULTURE, BODY FLUID W GRAM STAIN -BOTTLE
Culture: NO GROWTH
Culture: NO GROWTH

## 2020-01-14 LAB — PH, BODY FLUID: pH, Body Fluid: 8.4

## 2020-01-14 NOTE — Progress Notes (Signed)
Daily Rounding Note  01/14/2020, 2:10 PM  LOS: 5 days   SUBJECTIVE:   Chief complaint: Hepatic hydrothorax.  PBC.  Ascites.     Plans regarding Pleurx catheter placement on hold because she has no insurance which would allow for follow-up of the catheter. Patient expressing desire to go home and follow-up at [facility].  OBJECTIVE:         Vital signs in last 24 hours:    Temp:  [98.2 F (36.8 C)-98.6 F (37 C)] 98.2 F (36.8 C) (01/26 0543) Pulse Rate:  [93-116] 105 (01/26 1037) Resp:  [16-18] 17 (01/26 0924) BP: (102-124)/(59-74) 124/74 (01/26 0924) SpO2:  [93 %-99 %] 97 % (01/26 0924) Last BM Date: 01/13/20 Filed Weights   01/10/20 0739 01/11/20 0200 01/12/20 2200  Weight: 47.1 kg 43.9 kg 46.4 kg   General: comfortable. jaundiced Heart:  Sinus tach in the low 100s. Chest: Diminished breath sounds.  Slight dyspnea with speech.  No cough. Abdomen: Soft, nontender.  Active bowel sounds. Extremities: No CCE. Neuro/Psych: Alert.  Oriented x3.  No tremors.  Intake/Output from previous day: 01/25 0701 - 01/26 0700 In: 614 [P.O.:360; Blood:254] Out: 400 [Urine:400]  Intake/Output this shift: Total I/O In: 120 [P.O.:120] Out: 200 [Urine:200]  Lab Results: Recent Labs    01/12/20 0456 01/13/20 0839 01/14/20 0638  WBC 7.1 6.3 6.3  HGB 10.0* 10.8* 10.4*  HCT 30.3* 32.5* 31.3*  PLT 27* 36* 51*   BMET Recent Labs    01/12/20 0456 01/13/20 0839 01/14/20 0638  NA 133* 136 134*  K 3.8 4.2 3.8  CL 102 104 100  CO2 22 21* 23  GLUCOSE 114* 94 100*  BUN 14 13 12   CREATININE 0.82 1.09* 0.94  CALCIUM 8.7* 9.2 9.1   LFT Recent Labs    01/12/20 0456 01/13/20 0839 01/14/20 0638  PROT 4.7* 5.3* 5.2*  ALBUMIN 2.5* 2.7* 2.6*  AST 78* 89* 90*  ALT 48* 53* 53*  ALKPHOS 187* 206* 193*  BILITOT 9.4* 8.9* 9.7*   PT/INR Recent Labs    01/12/20 0456 01/13/20 0839  LABPROT 18.9* 17.1*  INR 1.6* 1.4*    Hepatitis Panel No results for input(s): HEPBSAG, HCVAB, HEPAIGM, HEPBIGM in the last 72 hours.  Studies/Results: DG CHEST PORT 1 VIEW  Result Date: 01/14/2020 CLINICAL DATA:  There was of breath, pleural effusion EXAM: PORTABLE CHEST 1 VIEW COMPARISON:  Portable exam 0859 hours compared to 01/13/2020 FINDINGS: Stable heart size and mediastinal contours. Large LEFT pleural effusion again identified with significant atelectasis of lower LEFT lung. Small RIGHT pleural effusion increased since previous exam with associated mild RIGHT basilar atelectasis. No definite infiltrate or pneumothorax. Bones unremarkable. IMPRESSION: BILATERAL pleural effusions and basilar atelectasis much greater on LEFT. Increased RIGHT pleural effusion since prior study. Electronically Signed   By: Lavonia Dana M.D.   On: 01/14/2020 09:14   DG CHEST PORT 1 VIEW  Addendum Date: 01/13/2020   ADDENDUM REPORT: 01/13/2020 22:46 ADDENDUM: Laterality error noted upon further review. Findings and impression should say: Re-accumulation of a large LEFT pleural effusion now with complete opacification of the LEFT hemithorax. Gradient opacity in the RIGHT lung base may reflect layering RIGHT pleural fluid or atelectasis. Electronically Signed   By: Lovena Le M.D.   On: 01/13/2020 22:46   Result Date: 01/13/2020 CLINICAL DATA:  Pleural effusion EXAM: PORTABLE CHEST 1 VIEW COMPARISON:  Radiograph 01/11/2020 FINDINGS: Re-accumulation of a large right pleural effusion now with  complete opacification of the left hemithorax. Suspect at least some associated passive atelectatic changes. Furthermore, new gradient opacity in the right lung base may reflect some layering pleural effusion on the contralateral side as well. Much of the cardiac silhouette is obscured by overlying opacity. Right heart border is grossly similar to comparison. No visible pneumothorax. No acute osseous or soft tissue abnormality. IMPRESSION: Re-accumulation of a large  right pleural effusion now with complete opacification of the left hemithorax. Gradient opacity in the right lung base may reflect layering right pleural fluid and/or atelectasis. Electronically Signed: By: Lovena Le M.D. On: 01/13/2020 06:08   DG Chest Port 1 View  Result Date: 01/13/2020 CLINICAL DATA:  Left thoracentesis. EXAM: PORTABLE CHEST 1 VIEW COMPARISON:  01/13/2020 and 01/11/2020. FINDINGS: Trachea is midline. Heart size is stable. There has been 50% decrease in size of a large left pleural effusion with airspace opacification in the adjacent lower left lung. No pneumothorax. Mild right basilar airspace opacification and/or layering right pleural fluid. IMPRESSION: 1. Large left pleural effusion, decreased in size by approximately 50% after thoracentesis. No pneumothorax. 2. Associated airspace opacification at the base of the left hemithorax, possibly due to atelectasis. Difficult to exclude pneumonia. 3. There may be layering pleural on the right. Electronically Signed   By: Lorin Picket M.D.   On: 01/13/2020 14:23   Scheduled Meds: . sodium chloride   Intravenous Once  . cinacalcet  30 mg Oral Q M,W,F  . furosemide  60 mg Oral Daily  . lactulose  20 g Oral TID  . midodrine  15 mg Oral TID WC  . pantoprazole  40 mg Oral Daily  . sodium chloride flush  3 mL Intravenous Q12H  . spironolactone  150 mg Oral Daily  . sulfamethoxazole-trimethoprim  1 tablet Oral Daily  . ursodiol  300 mg Oral BID   Continuous Infusions: PRN Meds:.hyoscyamine, ondansetron **OR** ondansetron (ZOFRAN) IV   ASSESMENT:   *    Recurrent hepatic hydrothorax.  Required repeat left thoracentesis x 2, right x1. Unfortunately she has rapidly reaccumulated transfusions bilaterally. Working on emergency Medicaid so that the Pleurx catheter can be placed.  But cannot place the Pleurx until she has insurance approval. 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  *     Ascites.  Currently on Aldactone 150 mg/day, furosemide 60 mg/day. So far renal function has not presented an issue with increased doses of diuretics.  *     Cirrhosis of the liver.  PBC.  On ursodiol.  *    thrombocytopenia, hypersplenism.  Has required platelet transfusion.  *   Hx HD.  Currently not an issue.  Continues on tid lactulose.    PLAN   *    Not a liver transplant or TIPS candidate due to hyperbilirubinemia.  *    Awaiting Medicaid approval decision.  *    Palliative care consult pleated, note to follow.  *   At this point the patient is about to sign her AMA form, go home and follow-up as soon as necessary at [facility].  Her son is here to take her home.    Hannah Donovan  01/14/2020, 2:10 PM Phone [phone]

## 2020-01-14 NOTE — TOC Progression Note (Signed)
Transition of Care [facility]) - Progression Note    Patient Details  Name: Hannah Donovan MRN: [id number] Date of Birth: June 19, 1976  Transition of Care [facility]) CM/SW Contact  Bartholomew Crews, RN Phone Number: [phone] 01/14/2020, 8:56 AM  Clinical Narrative:    Received call back from [facility]. Referral for hospice care accepted. TOC following.    Expected Discharge Plan: Home w Hospice Care Barriers to Discharge: Continued Medical Work up  Expected Discharge Plan and Services Expected Discharge Plan: [place] In-house Referral: Clinical Social Work, Hospice / Palliative Care Discharge Planning Services: CM Consult   Living arrangements for the past 2 months: Single Family Home                 DME Arranged: Chest tube pluerex                     Social Determinants of Health (SDOH) Interventions    Readmission Risk Interventions No flowsheet data found.

## 2020-01-14 NOTE — Progress Notes (Signed)
NAME:  Hannah Donovan, MRN:  [id number], DOB:  06/03/1976, LOS: 5 ADMISSION DATE:  01/09/2020, CONSULTATION DATE: 01/09/2020 REFERRING MD: Dr. Karmen Bongo, CHIEF COMPLAINT: Consult for thoracentesis and paracentesis  Brief History   44 year old female admitted 01/09/20 with two day history of dyspnea and abdominal distention. Known history of end-stage cirrhosis secondary to presumed PBC with recent paracentesis 10 days ago with 2.5L removed.  PCCM consulted for acute hypoxemic respiratory failure requiring paracentesis and thoracentesis.  She has primarily received all healthcare at [facility] after moving from [place] and at that she is actively attempting to be considered for liver transplant.   Lab work on admission relatively unremarkable apart from elevated LFTs and thrombocytopenia a platelet count of 37.  Chest x-ray with large left pleural effusion with ascites as well.   Past Medical History  Uterine cancer - treated with radiation in 2020 Thrombocytopenia  Cryptogenic cirrhosis  Depression Cholestasis of pregnancy   Significant Hospital Events   1/21 Admit, L thora with 1.5L removed, paracentesis with 1L removed 1/22 R thora with 1L removed 1/23 L thora with removal of 1.5L 1/25 L thora with removal of 1.4L 1/26 Left AMA Consults:  PCCM  Procedures:     Significant Diagnostic Tests:  CXR 01/21 > Moderate bilateral pleural effusions with associated bibasilar opacities, which may reflect a combination of atelectasis and pneumonia.  CXR 01/13/20 > Complete opacification of left hemithorax from reaccumulation of right pleural effusion as well  Micro Data:  COVID 01/21 > negative   Antimicrobials:  Home prophylactic Bactrim   Interim history/subjective:  S/p thora with removal of 1.4L. Overnight increased to 6L O2. However patient took off oxygen this morning and doing well on room air. Today, patient expressed frustration that we were giving up on her and that she still wishes  to pursue transplant. I again offered PleurX catheter placement to help manage her recurrent pleural effusions. Patient said she would be interested however before this could be arranged patient left AMA.  Objective   Blood pressure 113/62, pulse 95, temperature 98.2 F (36.8 C), temperature source Oral, resp. rate 16, height 5\' 1"  (1.549 m), weight 46.4 kg, SpO2 99 %.        Intake/Output Summary (Last 24 hours) at 01/14/2020 0827 Last data filed at 01/14/2020 0601 Gross per 24 hour  Intake 614 ml  Output 400 ml  Net 214 ml   Filed Weights   01/10/20 0739 01/11/20 0200 01/12/20 2200  Weight: 47.1 kg 43.9 kg 46.4 kg   Physical Exam: General: Thin, chronically ill-appearing, jaundiced HENT: [place], AT, OP clear, MMM Eyes: EOMI, scleral icterus,  Respiratory: Diminished breath sounds bilaterally L>R Cardiovascular: RRR, -M/R/G, no JVD GI: BS+, soft, nontender Extremities:-Edema,-tenderness, non-distended Neuro: AAO x4, CNII-XII grossly intact Skin: Intact, no rashes or bruising Psych: Normal mood, normal affect  CXR 01/14/20 reviewed and interpreted by me. S/p thoracentesis with left pleural effusion improved by ~50%  Resolved Hospital Problem list     Assessment & Plan:   Acute hypoxemic respiratory failure secondary to hepatic hydrothorax due to decompensated cryptogenic cirrhosis/PBC  S/p L thora x 3 and R thora x 1. S/p para x 1. Transudative fluid on initial analysis. Rapid accumulation post-procedures, now with bilateral pleural effusions L>R. Yesterday had thoracentesis with some improvement.  - Unfortunately, she is not a candidate for transplant and no longer a TIPS candidate due to her hyperbilirubinemia. I can offer PleurX catheter for symptomatic relief but this will not resolve her underlying issues.  Case management involved to obtain insurance approval/charity care for procedure and supplies. - Given her limited treatment options, palliative has been  consulted  Decompensated cryptogenic Cirrhosis with esophageal varices, portal hypertension   Recurrent Hepatic Hydrothorax & Ascites Hx SBP, Spontaneous Bacterial Empyema  Primary biliary cirrhosis Patient has been receiving the majority of her care through the [facility] health system and per review of records she has been requiring frequent moderate volume paracentesis every 2-3 weeks over the last 31yrs and several thoracentesis over the last few months. She is trying to purse liver transplant but due to lack of insurance, lack of support, and likely religious objections to blood products she has been deemed not a transplant candidate. MELD-NA score 20, 6% mortality  - GI following - Continue prophylactic bactrim   Thrombocytopenia  - S/p platelet transfusion yesterday - Follow CBC  - Monitor for bleeding   Patient left AMA at 2:49 PM. PleurX catheter cancelled. Will attempt to contact her primary hepatologist.   Rodman Pickle, M.D. [facility] Pulmonary/Critical Care Medicine 01/14/2020 8:37 AM   Labs   CBC: Recent Labs  Lab 01/09/20 0857 01/10/20 0423 01/10/20 1449 01/11/20 [id number] 01/12/20 0456 01/13/20 0839 01/14/20 0638  WBC 5.3   < > 5.1 6.1 7.1 6.3 6.3  NEUTROABS 3.8  --   --   --  5.3 4.1  --   HGB 11.0*   < > 9.5* 10.7* 10.0* 10.8* 10.4*  HCT 34.4*   < > 29.2* 32.7* 30.3* 32.5* 31.3*  MCV 110.3*   < > 107.7* 107.9* 107.1* 106.6* 106.1*  PLT 37*   < > 27* 27* 27* 36* 51*   < > = values in this interval not displayed.    Basic Metabolic Panel: Recent Labs  Lab 01/10/20 0423 01/11/20 [id number] 01/12/20 0456 01/13/20 0839 01/14/20 0638  NA 136 136 133* 136 134*  K 4.0 4.1 3.8 4.2 3.8  CL 107 106 102 104 100  CO2 20* 19* 22 21* 23  GLUCOSE 84 96 114* 94 100*  BUN 17 11 14 13 12   CREATININE 0.91 0.87 0.82 1.09* 0.94  CALCIUM 8.8* 8.8* 8.7* 9.2 9.1   GFR: Estimated Creatinine Clearance: 56.5 mL/min (by C-G formula based on SCr of 0.94 mg/dL). Recent Labs  Lab  01/11/20 0637 01/12/20 0456 01/13/20 0839 01/14/20 0638  WBC 6.1 7.1 6.3 6.3    Liver Function Tests: Recent Labs  Lab 01/10/20 0423 01/10/20 0423 01/10/20 0426 01/11/20 [id number] 01/12/20 0456 01/13/20 0839 01/14/20 0638  AST 70*  --   --  92* 78* 89* 90*  ALT 41  --   --  52* 48* 53* 53*  ALKPHOS 159*  --   --  197* 187* 206* 193*  BILITOT 8.7*  --   --  10.2* 9.4* 8.9* 9.7*  PROT 4.6*   < > 4.8* 5.2* 4.7* 5.3* 5.2*  ALBUMIN 2.8*  --   --  2.8* 2.5* 2.7* 2.6*   < > = values in this interval not displayed.   No results for input(s): LIPASE, AMYLASE in the last 168 hours. No results for input(s): AMMONIA in the last 168 hours.  ABG No results found for: PHART, PCO2ART, PO2ART, HCO3, TCO2, ACIDBASEDEF, O2SAT   Coagulation Profile: Recent Labs  Lab 01/09/20 0857 01/10/20 0423 01/12/20 0456 01/13/20 0839  INR 1.3* 1.5* 1.6* 1.4*    Cardiac Enzymes: No results for input(s): CKTOTAL, CKMB, CKMBINDEX, TROPONINI in the last 168 hours.  HbA1C: No results found for: HGBA1C  CBG: No results for input(s): GLUCAP in the last 168 hours.

## 2020-01-14 NOTE — Plan of Care (Signed)
  Problem: Activity: Goal: Risk for activity intolerance will decrease Outcome: Progressing   

## 2020-01-14 NOTE — Progress Notes (Signed)
Patient has decided to leave AMA. Doctor Lupita Leash has been made aware. Risk and danger have been explained to the patient, including cardiac arrest. Patient verbalizes understanding and AMA form has been signed. IVs were taken out.   Farley Ly RN

## 2020-01-14 NOTE — TOC Progression Note (Signed)
Transition of Care [facility]) - Progression Note    Patient Details  Name: Hannah Donovan MRN: [id number] Date of Birth: 11-13-1976  Transition of Care [facility]) CM/SW Contact  Bartholomew Crews, RN Phone Number: [phone] 01/14/2020, 12:01 PM  Clinical Narrative:    Spoke with patient at the bedside. Patient wanting to go home and be with her family, but she also wants to do everything she can to get better. Patient stated that she wanted  to return to her doctors at [facility] where she has been previously treated.  Advised that per MD she is not medically stable to be discharged home. Patient asked if she could go home with palliative services, therefore, she could go home without palliative and continue to seek medical treatment. Discussed that she was not medically stable to be discharged and needed medical interventions and monitoring and was not ready to be discharged with outpatient follow up. Patient verbalized understanding. Patient also asks that she not be considered a Jehovah's Witness, and that she would like to explore all possible options for medical treatment. TOC following for transition needs.    Expected Discharge Plan: Home w Hospice Care Barriers to Discharge: Continued Medical Work up  Expected Discharge Plan and Services Expected Discharge Plan: [place] In-house Referral: Clinical Social Work, Hospice / Palliative Care Discharge Planning Services: CM Consult   Living arrangements for the past 2 months: Single Family Home                 DME Arranged: Chest tube pluerex                     Social Determinants of Health (SDOH) Interventions    Readmission Risk Interventions No flowsheet data found.

## 2020-01-14 NOTE — Progress Notes (Signed)
PROGRESS NOTE    Hannah Donovan  [id number] DOB: 06-23-1976 DOA: 01/09/2020 PCP: Leta Speller, MD   Brief Narrative: 44 year old female with history of liver cirrhosis due to [place] (reports previous liver biopsy in [place]), with cholestasis of pregnancy in 1999, being followed by hepatology clinic at [facility], previous history of thoracentesis and  LV paracentesis, history of SBP/spontaneous bacterial empyema, on chronic prophylactic Bactrim, history of hepatic encephalopathy, thrombocytopenia/hypersplenism, severe protein calorie malnutrition, frequent hospitalization, 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  Patient was found to have symptomatic large pleural effusion.  1/21 Admit, L thora with 1.5L removed, paracentesis with 1L removed 1/22 R thora with 1L removed 1/23 L thora with removal of 1.5L 1/25Patient continues to require multiple thoracentesis, per pulmonary and GI. And had left thoracentesis.  Subjective:  Seen/examined this am She is upset about not being abel to get liver transplant/TIPS. Wants t ogo to [facility]. On 2l [place] weaned to 1l then to RA, saturated well at 92-94% Palliative care also came and spoke to her-did not want to discuss with palliative care. She is thinking about going home today-explained not stable for discharge.  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   Requiring LVP and thoracentesis multiple times. S/p paracentesis/thoracentesis, multiple times by critical care. No evidence of infection on fluid analysis. f/b GI and pulmonary, continue on lactulose, rifaximin, Aldactone and Lasix, ursodiol for PBC, cont  supportive  measures as needed paracentesis thoracentesis.  Cont prophylactic Bactrim.  Monitor electrolytes lfts,INR. Spoke with [facility] transfer center and discussed w hepatologist Dr Consuella Lose patient's lack of insurance, inability to afford post transplant meds- she is not a transplant candidate and given her elevated bilirubin not a candidate for TIPS. She recommends Pleurx catheter if patient is needing more than once a week of thoracentesis and continue her prophylactic Bactrim and will need weekly follow-up at [facility] for IV albumin to prevent renal failure.  But unable to accept  for transfer as [facility] is currently at capacity.  Spoke with the transfer team 1/25 and 1/26.    Symptomatic bilateral pleural effusion/hydrothorax: pulm following.  Status post right thoracentesis x1 and left thoracentesis x3 - rapid accumulation likely need Pleurx catheter, social worker and pulmonary working on resources to arrange for pleurex catheter.  Continue on prophylactic Bactrim diuretics as above.  history of SBP/ SBE (spontaneous bacterial empyema):  No evidence of infection of the pleural and ascitic fluid.  Continue prophylactic home Bactrim.    Hx of hepatic encephalopathy: Continue her home lactulose/rifaximin, monitor clinically with neuro checks.    Thrombocytopenia/hypersplenism: Monitor platelet count.  Patient transfuse platelet prior to procedure  1/21, 1/25. PLT much better. 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  Anemia of chronic disease due to cirrhosis/Jehovah's Witness.  Stable in 10 g.  Recent Labs  Lab 01/10/20 1449 01/11/20 0637 01/12/20 0456 01/13/20 0839 01/14/20 0638  HGB 9.5* 10.7* 10.0* 10.8* 10.4*    HCT 29.2* 32.7* 30.3* 32.5* 31.3*   Leukopenia again suspecting hypersplenism.  Resolved.  Lack  of insurance/undocumented: This is further causing problem in her management due to limited resources/follow-ups.  TOC consulted.  Patient believes after obtaining insurance she will be able to go on the transplant list at [facility]  DVT prophylaxis: SCD Code Status: Full code Family Communication: plan of care discussed with patient at bedside.updated her cousin over the phone. Disposition Plan: Remains inpatient. I called transfer center and discussed with Dr. Carlyon Shadow regarding transfer as pt also want to go to [facility]. I spoke w Hepatology 1/25 and 1/26-  currently not able to accept for transfer as they do not have beds.  Dr. Carlyon Shadow did tell me that patient is not a candidate for transplant based on her review of chart, patient is uninsured and there is questions about ability to afford post transplant medications as well, not a candidate for TIPS at this time due to elevated bili and at this time and advises Pleurxcatheter due to multiple thoracenteses requirement-is being contemplated by pulmonary-social worker has been contacted to arrange for resources re pleurex catheter.  I have advised patient not to leave [facility] explained risk associated.  Thinking about leaving the hospital and directly going to [facility]. Currently she is on room air and not short of breath. Palliative care has been consulted to navigate and address this complex scenerio.  Consultants: PCCM, GI, palliative care Procedures: CXR 01/21 > Moderate bilateral pleural effusions with associated bibasilar opacities, which may reflect a combination of atelectasis and pneumonia.  CXR 01/13/20 > Complete opacification of left hemithorax from reaccumulation of right pleural effusion as well S/P Thoracentesis and paracentesis as above  Microbiology: Ascitic fluid pleural fluid culture  NGTD Antimicrobials: Anti-infectives (From admission, onward)   Start     Dose/Rate Route Frequency Ordered Stop   01/09/20 1145  sulfamethoxazole-trimethoprim (BACTRIM DS) 800-160 MG per tablet 1 tablet     1 tablet Oral Daily 01/09/20 1135        Medications: Scheduled Meds: . sodium chloride   Intravenous Once  . cinacalcet  30 mg Oral Q M,W,F  . furosemide  60 mg Oral Daily  . lactulose  20 g Oral TID  . midodrine  15 mg Oral TID WC  . pantoprazole  40 mg Oral Daily  . sodium chloride flush  3 mL Intravenous Q12H  . spironolactone  150 mg Oral Daily  . sulfamethoxazole-trimethoprim  1 tablet Oral Daily  . ursodiol  300 mg Oral BID   Continuous Infusions:  Objective: Vitals:   01/13/20 2205 01/14/20 0543 01/14/20 0924 01/14/20 1037  BP: 110/62 113/62 124/74   Pulse: 94 95 (!) 116 (!) 105  Resp: 16 16 17    Temp: 98.6 F (37 C) 98.2 F (36.8 C)    TempSrc: Oral Oral    SpO2: 99% 99% 97%   Weight:      Height:        Intake/Output Summary (Last 24 hours) at 01/14/2020 1239 Last data filed at 01/14/2020 1054 Gross per 24 hour  Intake 734 ml  Output 600 ml  Net 134 ml   Filed Weights   01/10/20 0739 01/11/20 0200 01/12/20 2200  Weight: 47.1 kg 43.9 kg 46.4 kg   Weight change:   Body mass index is 19.33 kg/m.  Intake/Output from previous day: 01/25 0701 - 01/26 0700 In: 614 [P.O.:360; Blood:254] Out: 400 [Urine:400] Intake/Output this shift: Total I/O In: 120 [P.O.:120] Out: 200 [Urine:200]  Examination:  General exam: AAOx3, for her age, on room air not  in distress, appears upset HEENT:Oral mucosa moist, Ear/Nose WNL grossly, dentition normal. Respiratory system: Diminished breath sounds on the left side clear on the right, no use of accessory muscle Cardiovascular system: S1 & S2 +, No JVD,. Gastrointestinal system: Abdomen soft, full and mildly tender, BS+ Nervous System:Alert, awake,prioented x4, moving extremities and grossly  nonfocal Extremities: No edema, distal peripheral pulses palpable.  Skin: No rashes,no icterus. MSK: Normal muscle bulk,tone, power  Data Reviewed: I have personally reviewed following labs and imaging studies  CBC: Recent Labs  Lab 01/09/20 0857 01/10/20 0423 01/10/20 1449 01/11/20 0637 01/12/20 0456 01/13/20 0839 01/14/20 0638  WBC 5.3   < > 5.1 6.1 7.1 6.3 6.3  NEUTROABS 3.8  --   --   --  5.3 4.1  --   HGB 11.0*   < > 9.5* 10.7* 10.0* 10.8* 10.4*  HCT 34.4*   < > 29.2* 32.7* 30.3* 32.5* 31.3*  MCV 110.3*   < > 107.7* 107.9* 107.1* 106.6* 106.1*  PLT 37*   < > 27* 27* 27* 36* 51*   < > = values in this interval not displayed.   Basic Metabolic Panel: Recent Labs  Lab 01/10/20 0423 01/11/20 [id number] 01/12/20 0456 01/13/20 0839 01/14/20 0638  NA 136 136 133* 136 134*  K 4.0 4.1 3.8 4.2 3.8  CL 107 106 102 104 100  CO2 20* 19* 22 21* 23  GLUCOSE 84 96 114* 94 100*  BUN 17 11 14 13 12   CREATININE 0.91 0.87 0.82 1.09* 0.94  CALCIUM 8.8* 8.8* 8.7* 9.2 9.1   GFR: Estimated Creatinine Clearance: 56.5 mL/min (by C-G formula based on SCr of 0.94 mg/dL). Liver Function Tests: Recent Labs  Lab 01/10/20 0423 01/10/20 0423 01/10/20 0426 01/11/20 [id number] 01/12/20 0456 01/13/20 0839 01/14/20 0638  AST 70*  --   --  92* 78* 89* 90*  ALT 41  --   --  52* 48* 53* 53*  ALKPHOS 159*  --   --  197* 187* 206* 193*  BILITOT 8.7*  --   --  10.2* 9.4* 8.9* 9.7*  PROT 4.6*   < > 4.8* 5.2* 4.7* 5.3* 5.2*  ALBUMIN 2.8*  --   --  2.8* 2.5* 2.7* 2.6*   < > = values in this interval not displayed.   No results for input(s): LIPASE, AMYLASE in the last 168 hours. No results for input(s): AMMONIA in the last 168 hours. Coagulation Profile: Recent Labs  Lab 01/09/20 0857 01/10/20 0423 01/12/20 0456 01/13/20 0839  INR 1.3* 1.5* 1.6* 1.4*   Cardiac Enzymes: No results for input(s): CKTOTAL, CKMB, CKMBINDEX, TROPONINI in the last 168 hours. BNP (last 3 results) No results for  input(s): PROBNP in the last 8760 hours. HbA1C: No results for input(s): HGBA1C in the last 72 hours. CBG: No results for input(s): GLUCAP in the last 168 hours. Lipid Profile: No results for input(s): CHOL, HDL, LDLCALC, TRIG, CHOLHDL, LDLDIRECT in the last 72 hours. Thyroid Function Tests: No results for input(s): TSH, T4TOTAL, FREET4, T3FREE, THYROIDAB in the last 72 hours. Anemia Panel: No results for input(s): VITAMINB12, FOLATE, FERRITIN, TIBC, IRON, RETICCTPCT in the last 72 hours. Sepsis Labs: No results for input(s): PROCALCITON, LATICACIDVEN in the last 168 hours.  Recent Results (from the past 240 hour(s))  Respiratory Panel by RT PCR (Flu A&B, Covid) - Nasopharyngeal Swab     Status: None   Collection Time: 01/09/20 10:33 AM   Specimen: Nasopharyngeal Swab  Result Value Ref Range Status  SARS Coronavirus 2 by RT PCR NEGATIVE NEGATIVE Final    Comment: (NOTE) SARS-CoV-2 target nucleic acids are NOT DETECTED. The SARS-CoV-2 RNA is generally detectable in upper respiratoy specimens during the acute phase of infection. The lowest concentration of SARS-CoV-2 viral copies this assay can detect is 131 copies/mL. A negative result does not preclude SARS-Cov-2 infection and should not be used as the sole basis for treatment or other patient management decisions. A negative result may occur with  improper specimen collection/handling, submission of specimen other than nasopharyngeal swab, presence of viral mutation(s) within the areas targeted by this assay, and inadequate number of viral copies (<131 copies/mL). A negative result must be combined with clinical observations, patient history, and epidemiological information. The expected result is Negative. Fact Sheet for Patients:  PinkCheek.be Fact Sheet for Healthcare Providers:  GravelBags.it This test is not yet ap proved or cleared by the [place] FDA and   has been authorized for detection and/or diagnosis of SARS-CoV-2 by FDA under an Emergency Use Authorization (EUA). This EUA will remain  in effect (meaning this test can be used) for the duration of the COVID-19 declaration under Section 564(b)(1) of the Act, 21 U.S.C. section 360bbb-3(b)(1), unless the authorization is terminated or revoked sooner.    Influenza A by PCR NEGATIVE NEGATIVE Final   Influenza B by PCR NEGATIVE NEGATIVE Final    Comment: (NOTE) The Xpert Xpress SARS-CoV-2/FLU/RSV assay is intended as an aid in  the diagnosis of influenza from Nasopharyngeal swab specimens and  should not be used as a sole basis for treatment. Nasal washings and  aspirates are unacceptable for Xpert Xpress SARS-CoV-2/FLU/RSV  testing. Fact Sheet for Patients: PinkCheek.be Fact Sheet for Healthcare Providers: GravelBags.it This test is not yet approved or cleared by the [place] FDA and  has been authorized for detection and/or diagnosis of SARS-CoV-2 by  FDA under an Emergency Use Authorization (EUA). This EUA will remain  in effect (meaning this test can be used) for the duration of the  Covid-19 declaration under Section 564(b)(1) of the Act, 21  U.S.C. section 360bbb-3(b)(1), unless the authorization is  terminated or revoked. Performed at [facility], [place] [address] [place], [place] [address]   Culture, body fluid-bottle     Status: None   Collection Time: 01/09/20  4:24 PM   Specimen: Fluid  Result Value Ref Range Status   Specimen Description FLUID PLEURAL LEFT  Final   Special Requests BOTTLES DRAWN AEROBIC ONLY  Final   Culture   Final    NO GROWTH 5 DAYS Performed at [facility], [address] [place], [place] [address]    Report Status 01/14/2020 FINAL  Final  Gram stain     Status: None   Collection Time: 01/09/20  4:24 PM   Specimen: Fluid  Result Value Ref Range Status   Specimen  Description FLUID PLEURAL LEFT  Final   Special Requests NONE  Final   Gram Stain   Final    NO WBC SEEN NO ORGANISMS SEEN Performed at [facility], [place] [address] [place], [place] [address]    Report Status 01/10/2020 FINAL  Final  Culture, body fluid-bottle     Status: None   Collection Time: 01/09/20  4:24 PM   Specimen: Fluid  Result Value Ref Range Status   Specimen Description FLUID PERITONEAL  Final   Special Requests BOTTLES DRAWN AEROBIC ONLY  Final   Culture   Final  NO GROWTH 5 DAYS Performed at [facility], [place] [address], [place], [place] [address]    Report Status 01/14/2020 FINAL  Final  Gram stain     Status: None   Collection Time: 01/09/20  4:24 PM   Specimen: Fluid  Result Value Ref Range Status   Specimen Description FLUID PERITONEAL  Final   Special Requests NONE  Final   Gram Stain   Final    NO WBC SEEN NO ORGANISMS SEEN Performed at [facility], [place] [address], [place], [place] [address]    Report Status 01/10/2020 FINAL  Final  Body fluid culture     Status: None   Collection Time: 01/10/20  9:43 AM   Specimen: Pleura; Body Fluid  Result Value Ref Range Status   Specimen Description PLEURAL RIGHT  Final   Special Requests NONE  Final   Gram Stain NO WBC SEEN NO ORGANISMS SEEN   Final   Culture   Final    NO GROWTH 3 DAYS Performed at [facility], [address] [place], [place] [address]    Report Status 01/13/2020 FINAL  Final      Radiology Studies: DG CHEST PORT 1 VIEW  Result Date: 01/14/2020 CLINICAL DATA:  There was of breath, pleural effusion EXAM: PORTABLE CHEST 1 VIEW COMPARISON:  Portable exam 0859 hours compared to 01/13/2020 FINDINGS: Stable heart size and mediastinal contours. Large LEFT pleural effusion again identified with significant atelectasis of lower LEFT lung. Small RIGHT pleural effusion increased since previous exam with associated mild RIGHT basilar atelectasis. No definite infiltrate  or pneumothorax. Bones unremarkable. IMPRESSION: BILATERAL pleural effusions and basilar atelectasis much greater on LEFT. Increased RIGHT pleural effusion since prior study. Electronically Signed   By: Lavonia Dana M.D.   On: 01/14/2020 09:14   DG CHEST PORT 1 VIEW  Addendum Date: 01/13/2020   ADDENDUM REPORT: 01/13/2020 22:46 ADDENDUM: Laterality error noted upon further review. Findings and impression should say: Re-accumulation of a large LEFT pleural effusion now with complete opacification of the LEFT hemithorax. Gradient opacity in the RIGHT lung base may reflect layering RIGHT pleural fluid or atelectasis. Electronically Signed   By: Lovena Le M.D.   On: 01/13/2020 22:46   Result Date: 01/13/2020 CLINICAL DATA:  Pleural effusion EXAM: PORTABLE CHEST 1 VIEW COMPARISON:  Radiograph 01/11/2020 FINDINGS: Re-accumulation of a large right pleural effusion now with complete opacification of the left hemithorax. Suspect at least some associated passive atelectatic changes. Furthermore, new gradient opacity in the right lung base may reflect some layering pleural effusion on the contralateral side as well. Much of the cardiac silhouette is obscured by overlying opacity. Right heart border is grossly similar to comparison. No visible pneumothorax. No acute osseous or soft tissue abnormality. IMPRESSION: Re-accumulation of a large right pleural effusion now with complete opacification of the left hemithorax. Gradient opacity in the right lung base may reflect layering right pleural fluid and/or atelectasis. Electronically Signed: By: Lovena Le M.D. On: 01/13/2020 06:08   DG Chest Port 1 View  Result Date: 01/13/2020 CLINICAL DATA:  Left thoracentesis. EXAM: PORTABLE CHEST 1 VIEW COMPARISON:  01/13/2020 and 01/11/2020. FINDINGS: Trachea is midline. Heart size is stable. There has been 50% decrease in size of a large left pleural effusion with airspace opacification in the adjacent lower left lung. No  pneumothorax. Mild right basilar airspace opacification and/or layering right pleural fluid. IMPRESSION: 1. Large left pleural effusion, decreased in size by approximately 50% after  thoracentesis. No pneumothorax. 2. Associated airspace opacification at the base of the left hemithorax, possibly due to atelectasis. Difficult to exclude pneumonia. 3. There may be layering pleural on the right. Electronically Signed   By: Lorin Picket M.D.   On: 01/13/2020 14:23      LOS: 5 days   Time spent: More than 50% of that time was spent in counseling and/or coordination of care.  Antonieta Pert, MD [facility]  01/14/2020, 12:39 PM

## 2020-01-14 NOTE — Consult Note (Signed)
Consultation Note Date: 01/14/2020   Patient Name: Hannah Donovan  DOB: 1976/11/14  MRN: [id number]  Age / Sex: 44 y.o., female  PCP: Leta Speller, MD Referring Physician: Antonieta Pert, MD  Reason for Consultation: Establishing goals of care and Psychosocial/spiritual support  HPI/Patient Profile: 44 y.o. female   admitted on 01/09/2020 with past medical history significant of cryptogenic cirrhosis with ascites (last paracentesis was 1/13 at [facility]) and hepatic encephalopathy,  L hydrothorax; and depression presenting with abdominal pain and SOB.    She lives in [place], but has been going to [facility] for her care. She tells me today that she was living in [place] up until October/November 2020. She moved to [facility] in hopes of acquiring insurance enabling her to get a liver transplant.  She can tell when she gets fluid build up in her lung and was told to come tot he nearest hospital with worsening SOB.  She has been having pain in her back and SOB, especially with leaning forward. Last night, she tried to lie down, couldn't lie flat, O2 sats down to 88, too SOB.  This AM, her cousin called 74.   ED Course:  Large L pleural effusion, also with ascites.  Needs drainage and GI care.   Patient was seen here by gastroenterology and both her attending and GI have recommended hospice at this time. The patient is adamant that "I am not dying", she is refusing hospice services and wishes to be transitioned to [facility].   Yesterday the attending did contact [facility] and was told there was no bed and at this time they could not offer any transplant or tips procedure.   Patient faces the physical and emotional struggles of living with serious life limting illness   Clinical Assessment and Goals of Care:  This NP Wadie Lessen reviewed medical records, received report from team, assessed the patient and then meet at the  patient's bedside along with Dr Lupita Leash and her sister Cora Daniels by telephone to discuss diagnosis, prognosis, GOC, EOL wishes disposition and options.  Concept of Hospice and Palliative Care were discussed  A detailed discussion was had today regarding advanced directives.  Concepts specific to code status, artifical feeding and hydration, continued IV antibiotics and rehospitalization was had.  The difference between a aggressive medical intervention path  and a palliative comfort care path for this patient at this time was had.  Values and goals of care important to patient and family were attempted to be elicited.  I discussed with the patient that she was not a liver transplant or TIPS candidate due to hyperbilirubinemia according to GI. Consideration for Pleurx and home with hospice was discussed. Patient again is adamantly refusing. We discussed her option of signing out [place] and taking herself to [facility]; this is where she says she needs to be.  Signing out [place] is not recommended.   I again shared with Ms. Talamante the seriousness of her medical situation, her high risk of decompensation and the possibility that  anything could happen at any time.  Both the patient and the sister verbalized that this is what they want to do/no to [facility] on their own accord,  she wants to be assessed at [facility], she is willing to transport self, knowing that there may not be a bed and it likely would be through an  ER assessment.     Questions and concerns addressed.   Family encouraged to call with questions or concerns.    PMT will continue to support holistically.      NEXT OF KIN    SUMMARY OF RECOMMENDATIONS    Code Status/Advance Care Planning:  Full code   Additional Recommendations (Limitations, Scope, Preferences):  Full Scope Treatment  Psycho-social/Spiritual:   Desire for further Chaplaincy support:no  Additional Recommendations: Education on Hospice     Created space and opportunity for patient to explore thoughts and feelings regarding the current medical situation. She verbalizes frustration and states "they just went to let me die". She does not believe that this is the "end for her" and tells me she wishes to pursue treatment at [facility]. She verbalizes that family has resources" I need a liver transplant"    Emotional support offered  Prognosis:   Unable to determine  Discharge Planning: Patient is planning to sign herself out [facility] and her family is going to transport her to [facility] today     Primary Diagnoses: Present on Admission: . 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 Thrombocytopenia ([place])   I have reviewed the medical record, interviewed the patient and family, and examined the patient. The following aspects are pertinent.  Past Medical History:  Diagnosis Date  . Cholestasis of pregnancy 1999  . Cirrhosis, cryptogenic ([place])    with ascites, encephalopathy  . Depression   . Dyspnea   . Patient is Jehovah's Witness   . Primary biliary cirrhosis ([place])   . Radiation proctitis   . Refusal of blood transfusions as patient is Jehovah's Witness 01/11/2020  . Thrombocytopenia ([place])   . Uterine cancer ([place]) 2020   treated with radiation therapy in [place] History   Socioeconomic History  . Marital status: Married    Spouse name: Not on file  . Number of children: Not on file  . Years of education: Not on file  . Highest education level: Not on file  Occupational History  . Occupation: unemployed  Tobacco Use  . Smoking status: Never Smoker  . Smokeless tobacco: Never Used  Substance and Sexual Activity  . Alcohol use: Never  . Drug use: Never  . Sexual activity: Not Currently  Other Topics Concern  . Not on file  Social History Narrative  . Not on file   Social Determinants of Health   Financial Resource Strain:   . Difficulty of Paying Living Expenses: Not on file  Food Insecurity:   . Worried  About Charity fundraiser in the Last Year: Not on file  . Ran Out of Food in the Last Year: Not on file  Transportation Needs:   . Lack of Transportation (Medical): Not on file  . Lack of Transportation (Non-Medical): Not on file  Physical Activity:   . Days of Exercise per Week: Not on file  . Minutes of Exercise per Session: Not on file  Stress:   . Feeling of Stress : Not on file  Social Connections:   . Frequency of Communication with Friends and Family: Not on file  . Frequency of Social Gatherings  with Friends and Family: Not on file  . Attends Religious Services: Not on file  . Active Member of Clubs or Organizations: Not on file  . Attends Archivist Meetings: Not on file  . Marital Status: Not on file   Family History  Problem Relation Age of Onset  . Diabetes Father   . Liver disease Neg Hx    Scheduled Meds: . sodium chloride   Intravenous Once  . cinacalcet  30 mg Oral Q M,W,F  . furosemide  60 mg Oral Daily  . lactulose  20 g Oral TID  . midodrine  15 mg Oral TID WC  . pantoprazole  40 mg Oral Daily  . sodium chloride flush  3 mL Intravenous Q12H  . spironolactone  150 mg Oral Daily  . sulfamethoxazole-trimethoprim  1 tablet Oral Daily  . ursodiol  300 mg Oral BID   Continuous Infusions: PRN Meds:.hyoscyamine, ondansetron **OR** ondansetron (ZOFRAN) IV Medications Prior to Admission:  Prior to Admission medications   Medication Sig Start Date End Date Taking? Authorizing Provider  acetaminophen (TYLENOL) 500 MG tablet Take 1,000 mg by mouth every 12 (twelve) hours as needed for mild pain.   Yes [provider]  cholecalciferol (VITAMIN D3) 25 MCG (1000 UNIT) tablet Take 5,000 Units by mouth daily.   Yes [provider]  cinacalcet (SENSIPAR) 30 MG tablet Take 30 mg by mouth every Monday, Wednesday, and Friday.   Yes [provider]  ferrous sulfate 325 (65 FE) MG tablet Take 325 mg by mouth 2 (two) times daily.   Yes  [provider]  furosemide (LASIX) 40 MG tablet Take 40 mg by mouth daily.   Yes [provider]  lactulose (CHRONULAC) 10 GM/15ML solution Take 20 g by mouth 3 (three) times daily.   Yes [provider]  midodrine (PROAMATINE) 5 MG tablet Take 15 mg by mouth 3 (three) times daily with meals.   Yes [provider]  pantoprazole (PROTONIX) 40 MG tablet Take 40 mg by mouth daily.   Yes [provider]  spironolactone (ALDACTONE) 100 MG tablet Take 100 mg by mouth daily.   Yes [provider]  sulfamethoxazole-trimethoprim (BACTRIM DS) 800-160 MG tablet Take 1 tablet by mouth daily.   Yes [provider]  ursodiol (ACTIGALL) 300 MG capsule Take 300 mg by mouth 2 (two) times daily.   Yes [provider]   Allergies  Allergen Reactions  . Morphine And Related Other (See Comments)    BP drops   Review of Systems  Respiratory: Positive for shortness of breath.   Neurological: Positive for weakness.    Physical Exam Constitutional:      Appearance: She is cachectic. She is ill-appearing.     Interventions: Nasal cannula in place.  Cardiovascular:     Rate and Rhythm: Tachycardia present.  Musculoskeletal:     Comments: -Generlized weakness  Skin:    General: Skin is warm and dry.     Coloration: Skin is jaundiced.  Neurological:     Mental Status: She is alert.     Vital Signs: BP 124/74 (BP Location: Left Arm)   Pulse (!) 105   Temp 98.2 F (36.8 C) (Oral)   Resp 17   Ht 5\' 1"  (1.549 m)   Wt 46.4 kg   SpO2 97%   BMI 19.33 kg/m  Pain Scale: 0-10   Pain Score: 0-No pain   SpO2: SpO2: 97 % O2 Device:SpO2: 97 % O2 Flow  Rate: .O2 Flow Rate (L/min): 2 L/min  IO: Intake/output summary:   Intake/Output Summary (Last 24 hours) at 01/14/2020 1316 Last data filed at 01/14/2020 1054 Gross per 24 hour  Intake 614 ml  Output 400 ml  Net 214 ml    LBM: Last BM Date: 01/13/20 Baseline Weight: Weight: 47.1  kg Most recent weight: Weight: 46.4 kg     Palliative Assessment/Data: 50%   Discussed with Dr Lupita Leash  Time In: 1200 Time Out: 1315 Time Total: 75 minutes Greater than 50%  of this time was spent counseling and coordinating care related to the above assessment and plan.  Signed by: Wadie Lessen, NP   Please contact Palliative Medicine Team phone at [phone] for questions and concerns.  For individual provider: See Shea Evans

## 2020-01-15 DIAGNOSIS — Z7189 Other specified counseling: Secondary | ICD-10-CM

## 2020-01-15 DIAGNOSIS — Z515 Encounter for palliative care: Secondary | ICD-10-CM

## 2020-01-15 DIAGNOSIS — R188 Other ascites: Secondary | ICD-10-CM

## 2020-01-15 NOTE — Discharge Summary (Signed)
Physician Discharge Summary  Hannah Donovan [id number] DOB: May 22, 1976 DOA: 01/09/2020  PCP: Leta Speller, MD  Admit date: 01/09/2020 Discharge date: 01/15/2020  Admitted From: home Disposition:  AMA  Recommendations for Outpatient Follow-up:  1. Follow up with [facility] hepatology clinic ASAP or return to ER  Discharge Condition: Stable Code Status:FULL  Brief/Interim Summary: Please see the progress note done on the same day.  Patient is admitted with symptomatic recurrent hydrothorax needing multiple thoracentesis.  Plan also for Pleurx catheter and discussion was ongoing. Patient decided to leave [place]. Patient/patient's cousin stated that they are going to drive straight to [facility] hospital I had attempted to transfer the patient to [facility] call them twice, no bed available and unable to accept for transfer, this was communicated to the patient. I discussed with GI and pulmonary team here. Patient was advised not to leave [place] was explained risk of worsening respiratory status hypoxia which could lead to significant comorbidities including death. Patient is alert awake and oriented x4, verbalized consequences of leaving [place] despite that she left AMA after signing the paper.  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  Requiring LVP and thoracentesis multiple times.S/p paracentesis/thoracentesis, multiple times by critical care. No evidence of infection on fluid analysis.f/b GI and pulmonary, continue on lactulose, rifaximin, Aldactone and Lasix, ursodiol for PBC, cont  supportive measures as needed paracentesis thoracentesis.  Cont prophylactic Bactrim.  Monitor electrolytes lfts,INR. Spoke with [facility] transfer center and discussed w hepatologist Dr Consuella Lose patient's lack of insurance,  inability to afford post transplant meds- she is not a transplant candidate and given her elevated bilirubin not a candidate for TIPS. She recommends Pleurx catheter if patient is needing more than once a week of thoracentesis and continue her prophylactic Bactrim and will need weekly follow-up at [facility] for IV albumin to prevent renal failure.  But unable to accept  for transfer as [facility] is currently at capacity.  Spoke with the transfer team 1/25 and 1/26.    Symptomaticbilateral pleural effusion/hydrothorax: pulm following.  Status post right thoracentesis x1 and left thoracentesis x3 - rapid accumulation likely need Pleurx catheter, social worker and pulmonary working on resources to arrange for pleurex catheter.  Continue on prophylactic Bactrim diuretics as above.  history of SBP/ SBE (spontaneous bacterial empyema): No evidence of infection of the pleural and ascitic fluid.  Continue prophylactic home Bactrim.    Hx ofhepatic encephalopathy:Continue her home lactulose/rifaximin, monitor clinically with neuro checks.   Thrombocytopenia/hypersplenism:Monitor platelet count. Patient transfuse platelet prior to procedure 1/21, 1/25. PLT much better. 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  Stable in 10 g.  Last Labs          Recent Labs  Lab 01/10/20 1449 01/11/20 [id number] 01/12/20 0456 01/13/20 0839 01/14/20 0638  HGB 9.5* 10.7* 10.0* 10.8* 10.4*  HCT 29.2* 32.7* 30.3* 32.5* 31.3*     Leukopeniaagain suspecting hypersplenism.  Resolved.  Lack of insurance/undocumented:This is further causing problem in  her management due to limited resources/follow-ups.TOC consulted. Patient believes after obtaining insurance she will be  able to go on the transplant list at [facility]  DVT prophylaxis: SCD Code Status:Full code Family Communication:plan of care discussed with patient at bedside.updated her cousin over the phone. Disposition Plan:Remains inpatient. I called transfer center and discussed with Dr. Carlyon Shadow regarding transfer as pt also want to go to [facility]. I spoke w Hepatology 1/25 and 1/26-  currently not able to accept for transfer as they do not have beds.  Dr. Carlyon Shadow did tell me that patient is not a candidate for transplant based on her review of chart, patient is uninsured and there is questions about ability to afford post transplant medications as well, not a candidate for TIPS at this time due to elevated bili and at this time and advises Pleurxcatheter due to multiple thoracenteses requirement-is being contemplated by pulmonary-social worker has been contacted to arrange for resources re pleurex catheter.  I have advised patient not to leave [place] explained risk associated.  Thinking about leaving the hospital and directly going to [facility]. Currently she is on room air and not short of breath. Palliative care has been consulted to navigate and address this complex scenerio.  Consultants:PCCM, GI, palliative care Procedures: CXR 01/21 >Moderate bilateral pleural effusions with associated bibasilar opacities, which may reflect a combination of atelectasis and pneumonia.  CXR 01/13/20 >Complete opacification of left hemithorax from reaccumulation of right pleural effusion as well S/P Thoracentesis and paracentesis as above  Microbiology:Ascitic fluid pleural fluid culture NGTD  Subjective: Resting in bed no shortness of breath.  Was off oxygen this morning. Discharge Exam: Vitals:   01/14/20 0924 01/14/20 1037  BP: 124/74   Pulse: (!) 116 (!) 105   Resp: 17   Temp:    SpO2: 97%    General: Pt is alert, awake, not in acute distress Cardiovascular: RRR, S1/S2 +, no rubs, no gallops Respiratory: CTA bilaterally, no wheezing, no rhonchi Abdominal: Soft, NT, ND, bowel sounds + Extremities: no edema, no cyanosis  Discharge Instructions   Allergies as of 01/14/2020      Reactions   Morphine And Related Other (See Comments)   BP drops      Medication List    ASK your doctor about these medications   acetaminophen 500 MG tablet Commonly known as: TYLENOL Take 1,000 mg by mouth every 12 (twelve) hours as needed for mild pain.   cholecalciferol 25 MCG (1000 UNIT) tablet Commonly known as: VITAMIN D3 Take 5,000 Units by mouth daily.   cinacalcet 30 MG tablet Commonly known as: SENSIPAR Take 30 mg by mouth every Monday, Wednesday, and Friday.   ferrous sulfate 325 (65 FE) MG tablet Take 325 mg by mouth 2 (two) times daily.   furosemide 40 MG tablet Commonly known as: LASIX Take 40 mg by mouth daily.   lactulose 10 GM/15ML solution Commonly known as: CHRONULAC Take 20 g by mouth 3 (three) times daily.   midodrine 5 MG tablet Commonly known as: PROAMATINE Take 15 mg by mouth 3 (three) times daily with meals.   pantoprazole 40 MG tablet Commonly known as: PROTONIX Take 40 mg by mouth daily.   spironolactone 100 MG tablet Commonly known as: ALDACTONE Take 100 mg by mouth daily.   sulfamethoxazole-trimethoprim 800-160 MG tablet Commonly known as: BACTRIM DS Take 1 tablet by mouth daily.   ursodiol 300 MG capsule Commonly known as: ACTIGALL Take 300 mg by mouth 2 (two) times daily.       Allergies  Allergen Reactions  . Morphine And  Related Other (See Comments)    BP drops    The results of significant diagnostics from this hospitalization (including imaging, microbiology, ancillary and laboratory) are listed below for reference.    Microbiology: Recent Results (from the past 240 hour(s))  Respiratory  Panel by RT PCR (Flu A&B, Covid) - Nasopharyngeal Swab     Status: None   Collection Time: 01/09/20 10:33 AM   Specimen: Nasopharyngeal Swab  Result Value Ref Range Status   SARS Coronavirus 2 by RT PCR NEGATIVE NEGATIVE Final    Comment: (NOTE) SARS-CoV-2 target nucleic acids are NOT DETECTED. The SARS-CoV-2 RNA is generally detectable in upper respiratoy specimens during the acute phase of infection. The lowest concentration of SARS-CoV-2 viral copies this assay can detect is 131 copies/mL. A negative result does not preclude SARS-Cov-2 infection and should not be used as the sole basis for treatment or other patient management decisions. A negative result may occur with  improper specimen collection/handling, submission of specimen other than nasopharyngeal swab, presence of viral mutation(s) within the areas targeted by this assay, and inadequate number of viral copies (<131 copies/mL). A negative result must be combined with clinical observations, patient history, and epidemiological information. The expected result is Negative. Fact Sheet for Patients:  PinkCheek.be Fact Sheet for Healthcare Providers:  GravelBags.it This test is not yet ap proved or cleared by the [place] FDA and  has been authorized for detection and/or diagnosis of SARS-CoV-2 by FDA under an Emergency Use Authorization (EUA). This EUA will remain  in effect (meaning this test can be used) for the duration of the COVID-19 declaration under Section 564(b)(1) of the Act, 21 U.S.C. section 360bbb-3(b)(1), unless the authorization is terminated or revoked sooner.    Influenza A by PCR NEGATIVE NEGATIVE Final   Influenza B by PCR NEGATIVE NEGATIVE Final    Comment: (NOTE) The Xpert Xpress SARS-CoV-2/FLU/RSV assay is intended as an aid in  the diagnosis of influenza from Nasopharyngeal swab specimens and  should not be used as a sole basis for  treatment. Nasal washings and  aspirates are unacceptable for Xpert Xpress SARS-CoV-2/FLU/RSV  testing. Fact Sheet for Patients: PinkCheek.be Fact Sheet for Healthcare Providers: GravelBags.it This test is not yet approved or cleared by the [place] FDA and  has been authorized for detection and/or diagnosis of SARS-CoV-2 by  FDA under an Emergency Use Authorization (EUA). This EUA will remain  in effect (meaning this test can be used) for the duration of the  Covid-19 declaration under Section 564(b)(1) of the Act, 21  U.S.C. section 360bbb-3(b)(1), unless the authorization is  terminated or revoked. Performed at [facility], [place] [address], [place], [place] [address]   Culture, body fluid-bottle     Status: None   Collection Time: 01/09/20  4:24 PM   Specimen: Fluid  Result Value Ref Range Status   Specimen Description FLUID PLEURAL LEFT  Final   Special Requests BOTTLES DRAWN AEROBIC ONLY  Final   Culture   Final    NO GROWTH 5 DAYS Performed at [facility], [address] [place], [place] [address]    Report Status 01/14/2020 FINAL  Final  Gram stain     Status: None   Collection Time: 01/09/20  4:24 PM   Specimen: Fluid  Result Value Ref Range Status   Specimen Description FLUID PLEURAL LEFT  Final   Special Requests NONE  Final   Gram Stain   Final    NO WBC  SEEN NO ORGANISMS SEEN Performed at [facility], [place] [address] [place], [place] [address]    Report Status 01/10/2020 FINAL  Final  Culture, body fluid-bottle     Status: None   Collection Time: 01/09/20  4:24 PM   Specimen: Fluid  Result Value Ref Range Status   Specimen Description FLUID PERITONEAL  Final   Special Requests BOTTLES DRAWN AEROBIC ONLY  Final   Culture   Final    NO GROWTH 5 DAYS Performed at [facility], [place] [address], [place], [place] [address]    Report Status 01/14/2020 FINAL  Final  Gram  stain     Status: None   Collection Time: 01/09/20  4:24 PM   Specimen: Fluid  Result Value Ref Range Status   Specimen Description FLUID PERITONEAL  Final   Special Requests NONE  Final   Gram Stain   Final    NO WBC SEEN NO ORGANISMS SEEN Performed at [facility], [place] [address], [place], [place] [address]    Report Status 01/10/2020 FINAL  Final  Body fluid culture     Status: None   Collection Time: 01/10/20  9:43 AM   Specimen: Pleura; Body Fluid  Result Value Ref Range Status   Specimen Description PLEURAL RIGHT  Final   Special Requests NONE  Final   Gram Stain NO WBC SEEN NO ORGANISMS SEEN   Final   Culture   Final    NO GROWTH 3 DAYS Performed at [facility], [address], [place], [place] [address]    Report Status 01/13/2020 FINAL  Final    Procedures/Studies: DG CHEST PORT 1 VIEW  Result Date: 01/14/2020 CLINICAL DATA:  There was of breath, pleural effusion EXAM: PORTABLE CHEST 1 VIEW COMPARISON:  Portable exam 0859 hours compared to 01/13/2020 FINDINGS: Stable heart size and mediastinal contours. Large LEFT pleural effusion again identified with significant atelectasis of lower LEFT lung. Small RIGHT pleural effusion increased since previous exam with associated mild RIGHT basilar atelectasis. No definite infiltrate or pneumothorax. Bones unremarkable. IMPRESSION: BILATERAL pleural effusions and basilar atelectasis much greater on LEFT. Increased RIGHT pleural effusion since prior study. Electronically Signed   By: Lavonia Dana M.D.   On: 01/14/2020 09:14   DG CHEST PORT 1 VIEW  Addendum Date: 01/13/2020   ADDENDUM REPORT: 01/13/2020 22:46 ADDENDUM: Laterality error noted upon further review. Findings and impression should say: Re-accumulation of a large LEFT pleural effusion now with complete opacification of the LEFT hemithorax. Gradient opacity in the RIGHT lung base may reflect layering RIGHT pleural fluid or atelectasis. Electronically Signed   By:  Lovena Le M.D.   On: 01/13/2020 22:46   Result Date: 01/13/2020 CLINICAL DATA:  Pleural effusion EXAM: PORTABLE CHEST 1 VIEW COMPARISON:  Radiograph 01/11/2020 FINDINGS: Re-accumulation of a large right pleural effusion now with complete opacification of the left hemithorax. Suspect at least some associated passive atelectatic changes. Furthermore, new gradient opacity in the right lung base may reflect some layering pleural effusion on the contralateral side as well. Much of the cardiac silhouette is obscured by overlying opacity. Right heart border is grossly similar to comparison. No visible pneumothorax. No acute osseous or soft tissue abnormality. IMPRESSION: Re-accumulation of a large right pleural effusion now with complete opacification of the left hemithorax. Gradient opacity in the right lung base may reflect layering right pleural fluid and/or atelectasis. Electronically Signed: By: Lovena Le M.D. On: 01/13/2020 06:08   DG  Chest Port 1 View  Result Date: 01/13/2020 CLINICAL DATA:  Left thoracentesis. EXAM: PORTABLE CHEST 1 VIEW COMPARISON:  01/13/2020 and 01/11/2020. FINDINGS: Trachea is midline. Heart size is stable. There has been 50% decrease in size of a large left pleural effusion with airspace opacification in the adjacent lower left lung. No pneumothorax. Mild right basilar airspace opacification and/or layering right pleural fluid. IMPRESSION: 1. Large left pleural effusion, decreased in size by approximately 50% after thoracentesis. No pneumothorax. 2. Associated airspace opacification at the base of the left hemithorax, possibly due to atelectasis. Difficult to exclude pneumonia. 3. There may be layering pleural on the right. Electronically Signed   By: Lorin Picket M.D.   On: 01/13/2020 14:23   DG CHEST PORT 1 VIEW  Result Date: 01/11/2020 CLINICAL DATA:  Shortness of breath and abdominal pain. EXAM: PORTABLE CHEST 1 VIEW COMPARISON:  January 11, 2020 FINDINGS: Moderate to  marked severity atelectasis and/or infiltrate is seen within the left lung base. There is a moderate size left pleural effusion. This is markedly decreased in size when compared to the prior exam. No pneumothorax is identified. The heart size and mediastinal contours are within normal limits. The visualized skeletal structures are unremarkable. IMPRESSION: 1. Moderate to marked severity left basilar atelectasis and/or infiltrate. 2. Moderate size left pleural effusion, decreased in severity when compared to the prior study dated January 11, 2020. Electronically Signed   By: Virgina Norfolk M.D.   On: 01/11/2020 16:22   DG CHEST PORT 1 VIEW  Result Date: 01/11/2020 CLINICAL DATA:  Pleural effusion EXAM: PORTABLE CHEST 1 VIEW COMPARISON:  01/10/2020 FINDINGS: Progression of left effusion with complete opacification left hemithorax and collapse of the left lung. Small right effusion and right lower lobe airspace disease unchanged. IMPRESSION: Progression of large left effusion which now occupies entire left hemithorax and collapse of the left lung. Recommend thoracentesis. Right lower lobe airspace disease and right effusion unchanged. Electronically Signed   By: Franchot Gallo M.D.   On: 01/11/2020 09:03   DG CHEST PORT 1 VIEW  Result Date: 01/10/2020 CLINICAL DATA:  Pleural effusion, had thoracentesis on 01/09/2020 EXAM: PORTABLE CHEST 1 VIEW COMPARISON:  Portable exam 1628 hours compared to 1006 hours FINDINGS: Stable heart size and mediastinal contours. Small RIGHT pleural effusion increased from previous exam. Very large LEFT pleural effusion increased from previous exam. Significant atelectasis of entirety of LEFT lung and of RIGHT lung base. No pneumothorax or acute osseous findings. IMPRESSION: Very large LEFT and small LEFT pleural effusions increased since earlier study. Electronically Signed   By: Lavonia Dana M.D.   On: 01/10/2020 16:46   DG CHEST PORT 1 VIEW  Result Date: 01/10/2020 CLINICAL  DATA:  Status post thoracentesis EXAM: PORTABLE CHEST 1 VIEW COMPARISON:  Chest radiograph from one day prior. FINDINGS: Stable cardiomediastinal silhouette with normal heart size. Tiny residual right hydropneumothorax at the right costophrenic angle, with the pleural effusion component decreased. Moderate to large left pleural effusion, significantly increased. No pulmonary edema. Residual hazy patchy right lung base opacity. Worsening aeration at the left lung base. IMPRESSION: 1. Tiny residual right hydropneumothorax at the right costophrenic angle, with decreased pleural effusion component. 2. Moderate to large left pleural effusion, significantly increased. 3. Hazy residual right lung base opacity and worsening left lung base opacity, favor atelectasis. These results will be called to the ordering clinician or representative by the Radiologist Assistant, and communication documented in the PACS or zVision Dashboard. Electronically Signed   By: Rinaldo Ratel  Poff M.D.   On: 01/10/2020 10:24   DG CHEST PORT 1 VIEW  Result Date: 01/09/2020 CLINICAL DATA:  Pleural effusions EXAM: PORTABLE CHEST 1 VIEW COMPARISON:  Portable exam 1647 hours compared to 10/11 hours FINDINGS: Normal heart size and mediastinal contours. BILATERAL small to moderate pleural effusions with bibasilar atelectasis. Upper lungs clear. No pneumothorax or acute osseous findings. IMPRESSION: Persistent bibasilar pleural effusions and atelectasis. Electronically Signed   By: Lavonia Dana M.D.   On: 01/09/2020 17:23   DG Chest Portable 1 View  Result Date: 01/09/2020 CLINICAL DATA:  Shortness of breath EXAM: PORTABLE CHEST 1 VIEW COMPARISON:  None. FINDINGS: Heart size is poorly evaluated but appears within normal limits. Lung volumes are low. Moderate bilateral pleural effusions with associated bibasilar opacities. No pneumothorax. Osseous structures appear intact. IMPRESSION: Moderate bilateral pleural effusions with associated bibasilar  opacities, which may reflect a combination of atelectasis and pneumonia. Electronically Signed   By: Davina Poke D.O.   On: 01/09/2020 10:18     Labs: BNP (last 3 results) No results for input(s): BNP in the last 8760 hours. Basic Metabolic Panel: Recent Labs  Lab 01/10/20 0423 01/11/20 [id number] 01/12/20 0456 01/13/20 0839 01/14/20 0638  NA 136 136 133* 136 134*  K 4.0 4.1 3.8 4.2 3.8  CL 107 106 102 104 100  CO2 20* 19* 22 21* 23  GLUCOSE 84 96 114* 94 100*  BUN 17 11 14 13 12   CREATININE 0.91 0.87 0.82 1.09* 0.94  CALCIUM 8.8* 8.8* 8.7* 9.2 9.1   Liver Function Tests: Recent Labs  Lab 01/10/20 0423 01/10/20 0423 01/10/20 0426 01/11/20 0637 01/12/20 0456 01/13/20 0839 01/14/20 0638  AST 70*  --   --  92* 78* 89* 90*  ALT 41  --   --  52* 48* 53* 53*  ALKPHOS 159*  --   --  197* 187* 206* 193*  BILITOT 8.7*  --   --  10.2* 9.4* 8.9* 9.7*  PROT 4.6*   < > 4.8* 5.2* 4.7* 5.3* 5.2*  ALBUMIN 2.8*  --   --  2.8* 2.5* 2.7* 2.6*   < > = values in this interval not displayed.   No results for input(s): LIPASE, AMYLASE in the last 168 hours. No results for input(s): AMMONIA in the last 168 hours. CBC: Recent Labs  Lab 01/09/20 0857 01/10/20 0423 01/10/20 1449 01/11/20 0637 01/12/20 0456 01/13/20 0839 01/14/20 0638  WBC 5.3   < > 5.1 6.1 7.1 6.3 6.3  NEUTROABS 3.8  --   --   --  5.3 4.1  --   HGB 11.0*   < > 9.5* 10.7* 10.0* 10.8* 10.4*  HCT 34.4*   < > 29.2* 32.7* 30.3* 32.5* 31.3*  MCV 110.3*   < > 107.7* 107.9* 107.1* 106.6* 106.1*  PLT 37*   < > 27* 27* 27* 36* 51*   < > = values in this interval not displayed.   Cardiac Enzymes: No results for input(s): CKTOTAL, CKMB, CKMBINDEX, TROPONINI in the last 168 hours. BNP: Invalid input(s): POCBNP CBG: No results for input(s): GLUCAP in the last 168 hours. D-Dimer No results for input(s): DDIMER in the last 72 hours. Hgb A1c No results for input(s): HGBA1C in the last 72 hours. Lipid Profile No results  for input(s): CHOL, HDL, LDLCALC, TRIG, CHOLHDL, LDLDIRECT in the last 72 hours. Thyroid function studies No results for input(s): TSH, T4TOTAL, T3FREE, THYROIDAB in the last 72 hours.  Invalid input(s): FREET3 Anemia work up  No results for input(s): VITAMINB12, FOLATE, FERRITIN, TIBC, IRON, RETICCTPCT in the last 72 hours. Urinalysis No results found for: COLORURINE, APPEARANCEUR, [place], [place], [place], [place], [place], [place], PROTEINUR, UROBILINOGEN, NITRITE, LEUKOCYTESUR Sepsis Labs Invalid input(s): PROCALCITONIN,  WBC,  LACTICIDVEN Microbiology Recent Results (from the past 240 hour(s))  Respiratory Panel by RT PCR (Flu A&B, Covid) - Nasopharyngeal Swab     Status: None   Collection Time: 01/09/20 10:33 AM   Specimen: Nasopharyngeal Swab  Result Value Ref Range Status   SARS Coronavirus 2 by RT PCR NEGATIVE NEGATIVE Final    Comment: (NOTE) SARS-CoV-2 target nucleic acids are NOT DETECTED. The SARS-CoV-2 RNA is generally detectable in upper respiratoy specimens during the acute phase of infection. The lowest concentration of SARS-CoV-2 viral copies this assay can detect is 131 copies/mL. A negative result does not preclude SARS-Cov-2 infection and should not be used as the sole basis for treatment or other patient management decisions. A negative result may occur with  improper specimen collection/handling, submission of specimen other than nasopharyngeal swab, presence of viral mutation(s) within the areas targeted by this assay, and inadequate number of viral copies (<131 copies/mL). A negative result must be combined with clinical observations, patient history, and epidemiological information. The expected result is Negative. Fact Sheet for Patients:  PinkCheek.be Fact Sheet for Healthcare Providers:  GravelBags.it This test is not yet ap proved or cleared by the [place] FDA and  has been  authorized for detection and/or diagnosis of SARS-CoV-2 by FDA under an Emergency Use Authorization (EUA). This EUA will remain  in effect (meaning this test can be used) for the duration of the COVID-19 declaration under Section 564(b)(1) of the Act, 21 U.S.C. section 360bbb-3(b)(1), unless the authorization is terminated or revoked sooner.    Influenza A by PCR NEGATIVE NEGATIVE Final   Influenza B by PCR NEGATIVE NEGATIVE Final    Comment: (NOTE) The Xpert Xpress SARS-CoV-2/FLU/RSV assay is intended as an aid in  the diagnosis of influenza from Nasopharyngeal swab specimens and  should not be used as a sole basis for treatment. Nasal washings and  aspirates are unacceptable for Xpert Xpress SARS-CoV-2/FLU/RSV  testing. Fact Sheet for Patients: PinkCheek.be Fact Sheet for Healthcare Providers: GravelBags.it This test is not yet approved or cleared by the [place] FDA and  has been authorized for detection and/or diagnosis of SARS-CoV-2 by  FDA under an Emergency Use Authorization (EUA). This EUA will remain  in effect (meaning this test can be used) for the duration of the  Covid-19 declaration under Section 564(b)(1) of the Act, 21  U.S.C. section 360bbb-3(b)(1), unless the authorization is  terminated or revoked. Performed at [facility], [place] [address], [place], [place] [address]   Culture, body fluid-bottle     Status: None   Collection Time: 01/09/20  4:24 PM   Specimen: Fluid  Result Value Ref Range Status   Specimen Description FLUID PLEURAL LEFT  Final   Special Requests BOTTLES DRAWN AEROBIC ONLY  Final   Culture   Final    NO GROWTH 5 DAYS Performed at [facility], [address], [place], [place] [address]    Report Status 01/14/2020 FINAL  Final  Gram stain     Status: None   Collection Time: 01/09/20  4:24 PM   Specimen: Fluid  Result Value Ref Range Status   Specimen Description  FLUID PLEURAL LEFT  Final   Special Requests NONE  Final   Gram Stain  Final    NO WBC SEEN NO ORGANISMS SEEN Performed at [facility], [place] [address], [place], [place] [address]    Report Status 01/10/2020 FINAL  Final  Culture, body fluid-bottle     Status: None   Collection Time: 01/09/20  4:24 PM   Specimen: Fluid  Result Value Ref Range Status   Specimen Description FLUID PERITONEAL  Final   Special Requests BOTTLES DRAWN AEROBIC ONLY  Final   Culture   Final    NO GROWTH 5 DAYS Performed at [facility], [place] [address] [place], [place] [address]    Report Status 01/14/2020 FINAL  Final  Gram stain     Status: None   Collection Time: 01/09/20  4:24 PM   Specimen: Fluid  Result Value Ref Range Status   Specimen Description FLUID PERITONEAL  Final   Special Requests NONE  Final   Gram Stain   Final    NO WBC SEEN NO ORGANISMS SEEN Performed at [facility], [place] [address], [place], [place] [address]    Report Status 01/10/2020 FINAL  Final  Body fluid culture     Status: None   Collection Time: 01/10/20  9:43 AM   Specimen: Pleura; Body Fluid  Result Value Ref Range Status   Specimen Description PLEURAL RIGHT  Final   Special Requests NONE  Final   Gram Stain NO WBC SEEN NO ORGANISMS SEEN   Final   Culture   Final    NO GROWTH 3 DAYS Performed at [facility], [address] [place], [place] [address]    Report Status 01/13/2020 FINAL  Final     Time coordinating discharge: 25  minutes  SIGNED: Antonieta Pert, MD  [facility] 01/15/2020, 4:26 PM  If 7PM-7AM, please contact night-coverage [email or web address]

## 2020-01-16 ENCOUNTER — Encounter (HOSPITAL_COMMUNITY): Payer: Self-pay

## 2020-01-19 MED ORDER — GENERIC EXTERNAL MEDICATION
15.00 | Status: DC
Start: 2020-01-21 — End: 2020-01-19

## 2020-01-19 MED ORDER — GENERIC EXTERNAL MEDICATION
2.00 | Status: DC
Start: 2020-01-20 — End: 2020-01-19

## 2020-01-19 MED ORDER — CINACALCET HCL 30 MG PO TABS
30.00 | ORAL_TABLET | ORAL | Status: DC
Start: 2020-01-21 — End: 2020-01-19

## 2020-01-19 MED ORDER — LACTULOSE 10 GM/15ML PO SOLN
20.00 | ORAL | Status: DC
Start: 2020-01-19 — End: 2020-01-19

## 2020-01-19 MED ORDER — CHOLECALCIFEROL 25 MCG (1000 UT) PO TABS
5000.00 | ORAL_TABLET | ORAL | Status: DC
Start: 2020-01-22 — End: 2020-01-19

## 2020-01-19 MED ORDER — URSODIOL 300 MG PO CAPS
300.00 | ORAL_CAPSULE | ORAL | Status: DC
Start: 2020-01-21 — End: 2020-01-19

## 2020-01-19 MED ORDER — PANTOPRAZOLE SODIUM 40 MG PO TBEC
40.00 | DELAYED_RELEASE_TABLET | ORAL | Status: DC
Start: 2020-01-22 — End: 2020-01-19

## 2020-01-21 MED ORDER — LIDOCAINE 5 % EX PTCH
1.00 | MEDICATED_PATCH | CUTANEOUS | Status: DC
Start: 2020-01-21 — End: 2020-01-21

## 2020-01-21 MED ORDER — LACTULOSE 10 GM/15ML PO SOLN
20.00 | ORAL | Status: DC
Start: 2020-01-21 — End: 2020-01-21

## 2020-01-21 MED ORDER — DEXTROSE 50 % IV SOLN
50.00 | INTRAVENOUS | Status: DC
Start: ? — End: 2020-01-21

## 2020-01-21 MED ORDER — ONDANSETRON HCL 8 MG PO TABS
4.00 | ORAL_TABLET | ORAL | Status: DC
Start: ? — End: 2020-01-21

## 2020-01-21 MED ORDER — SULFAMETHOXAZOLE-TRIMETHOPRIM 800-160 MG PO TABS
1.00 | ORAL_TABLET | ORAL | Status: DC
Start: 2020-01-22 — End: 2020-01-21

## 2020-01-21 MED ORDER — LACTATED RINGERS IV SOLN
10.00 | INTRAVENOUS | Status: DC
Start: ? — End: 2020-01-21

## 2020-12-02 IMAGING — DX DG CHEST 1V PORT
1 series · 1 of 1 positions shown · non-contrast
Comparison: Portable exam 9191 hours compared to 6881 hours

CLINICAL DATA: Pleural effusion, had thoracentesis on 01/09/2020

EXAM:
PORTABLE CHEST 1 VIEW

[chest ap]
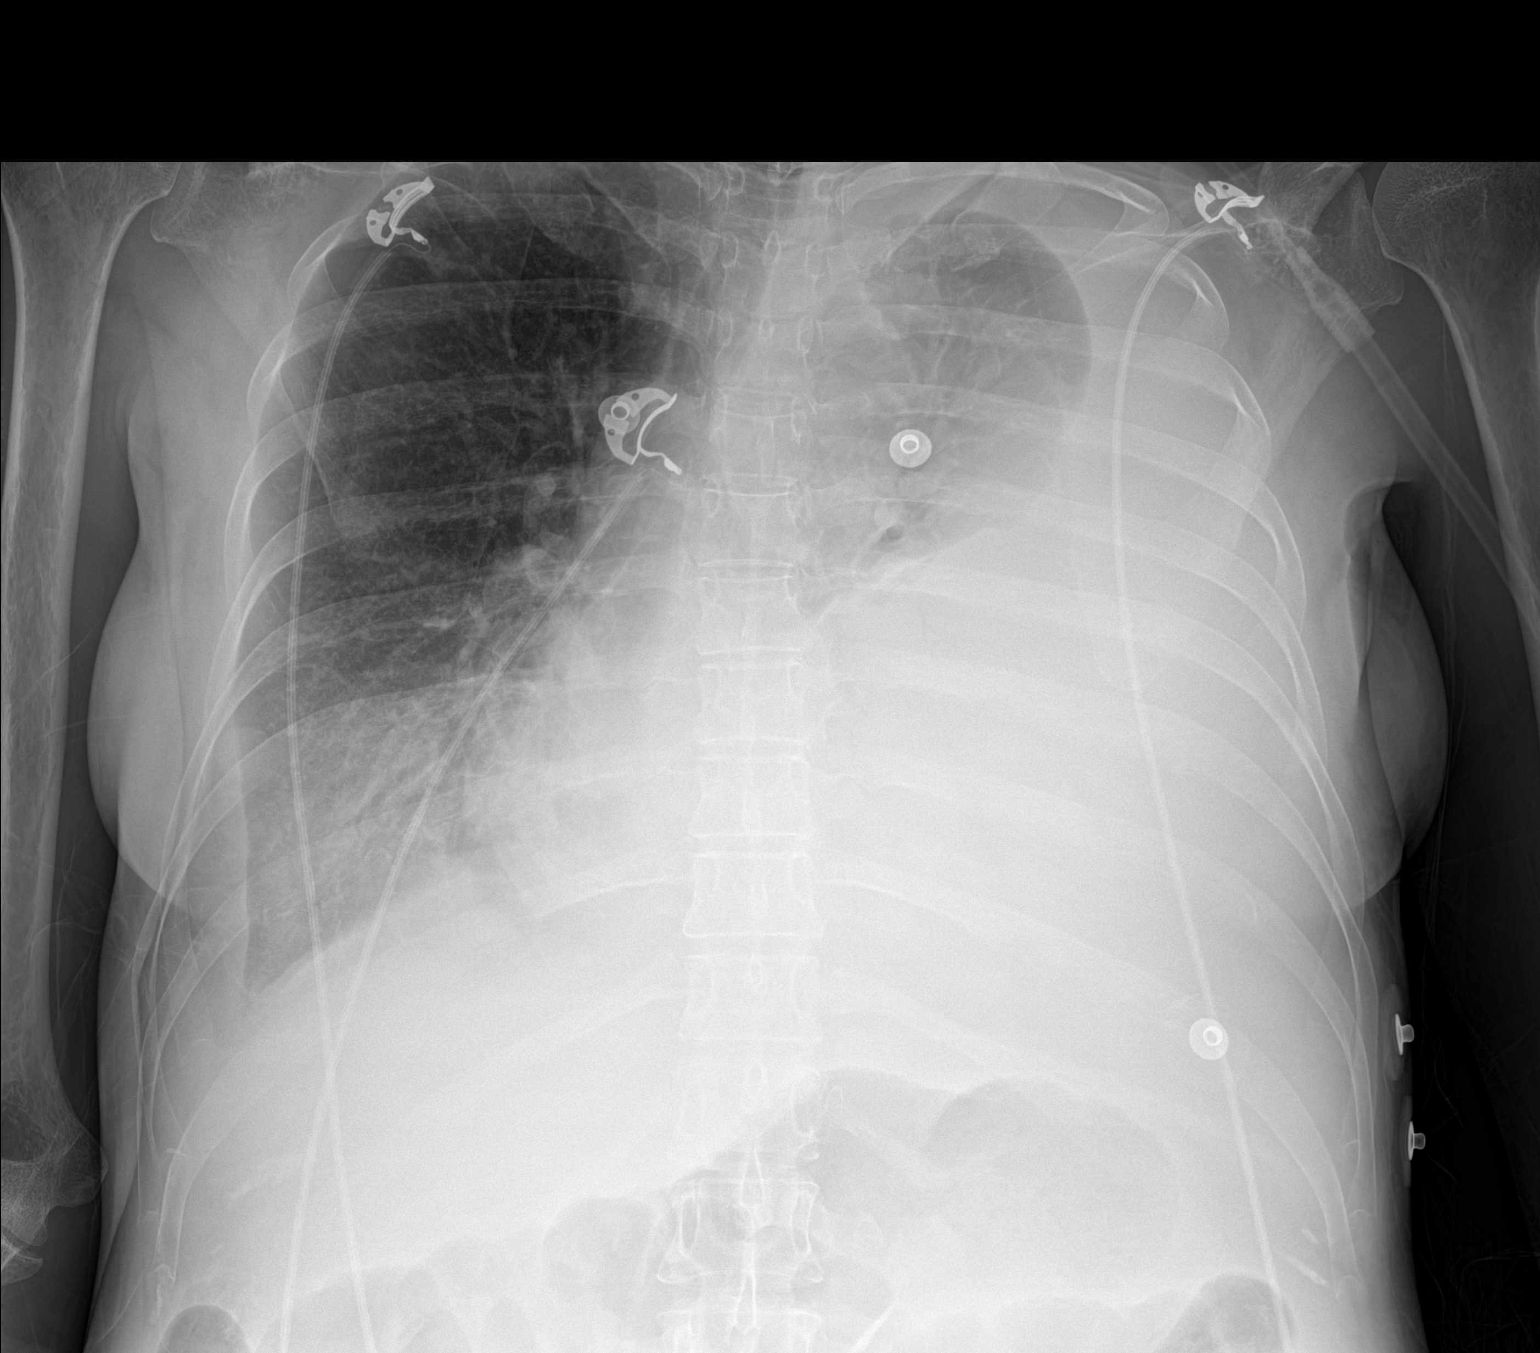

[1 of 1 positions shown; findings below may reference images not displayed]

FINDINGS: Stable heart size and mediastinal contours.

Small RIGHT pleural effusion increased from previous exam.

Very large LEFT pleural effusion increased from previous exam.

Significant atelectasis of entirety of LEFT lung and of RIGHT lung
base.

No pneumothorax or acute osseous findings.
IMPRESSION: Very large LEFT and small LEFT pleural effusions increased since
earlier study.

## 2020-12-03 IMAGING — DX DG CHEST 1V PORT
1 series · 1 of 1 positions shown · non-contrast
Comparison: 01/10/2020

CLINICAL DATA: Pleural effusion

EXAM:
PORTABLE CHEST 1 VIEW

[chest]
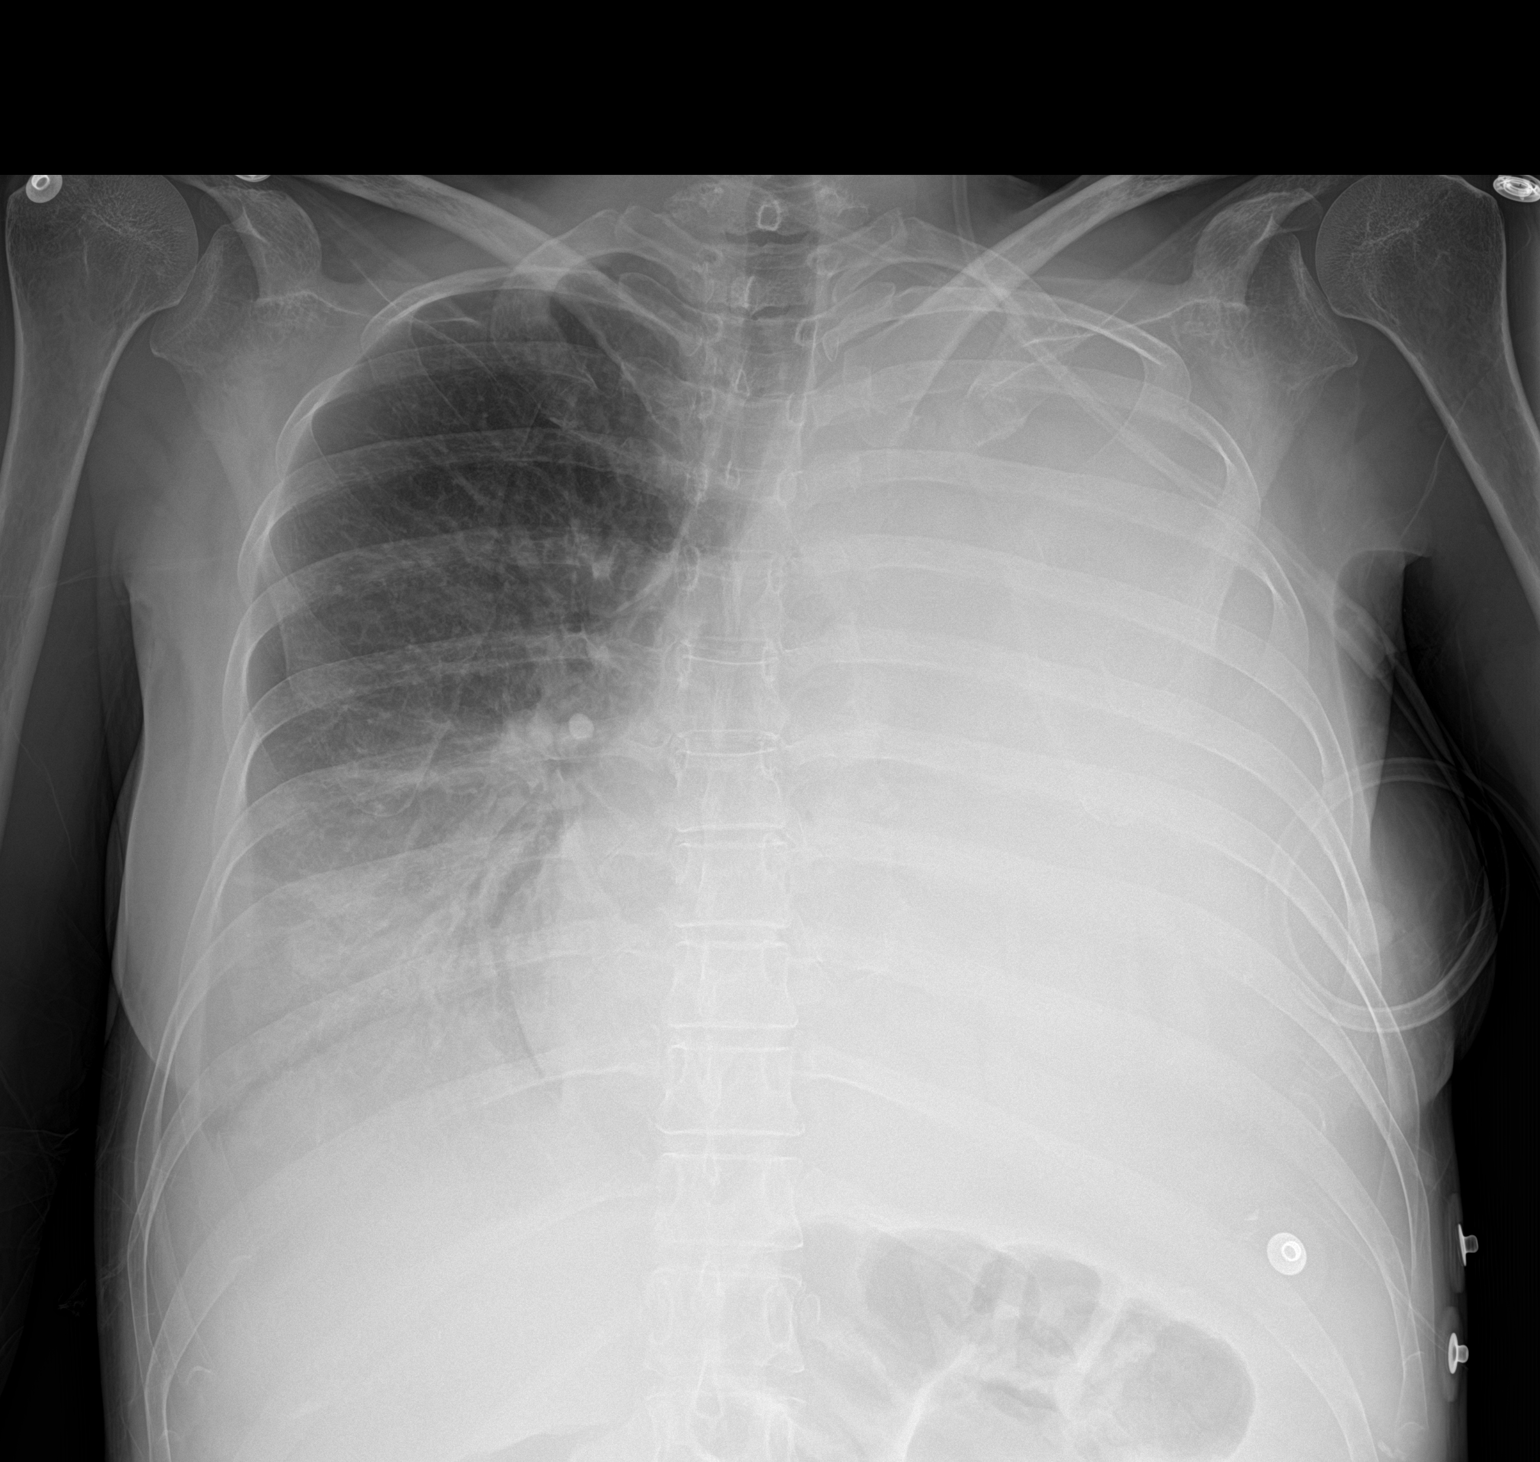

[1 of 1 positions shown; findings below may reference images not displayed]

FINDINGS: Progression of left effusion with complete opacification left
hemithorax and collapse of the left lung.

Small right effusion and right lower lobe airspace disease
unchanged.
IMPRESSION: Progression of large left effusion which now occupies entire left
hemithorax and collapse of the left lung. Recommend thoracentesis.

Right lower lobe airspace disease and right effusion unchanged.

## 2020-12-05 IMAGING — DX DG CHEST 1V PORT
1 series · 1 of 1 positions shown · non-contrast
Comparison: Radiograph 01/11/2020
COMPARISON: Radiograph 01/11/2020

Addendum:
CLINICAL DATA: Pleural effusion

EXAM:
PORTABLE CHEST 1 VIEW

[chest]
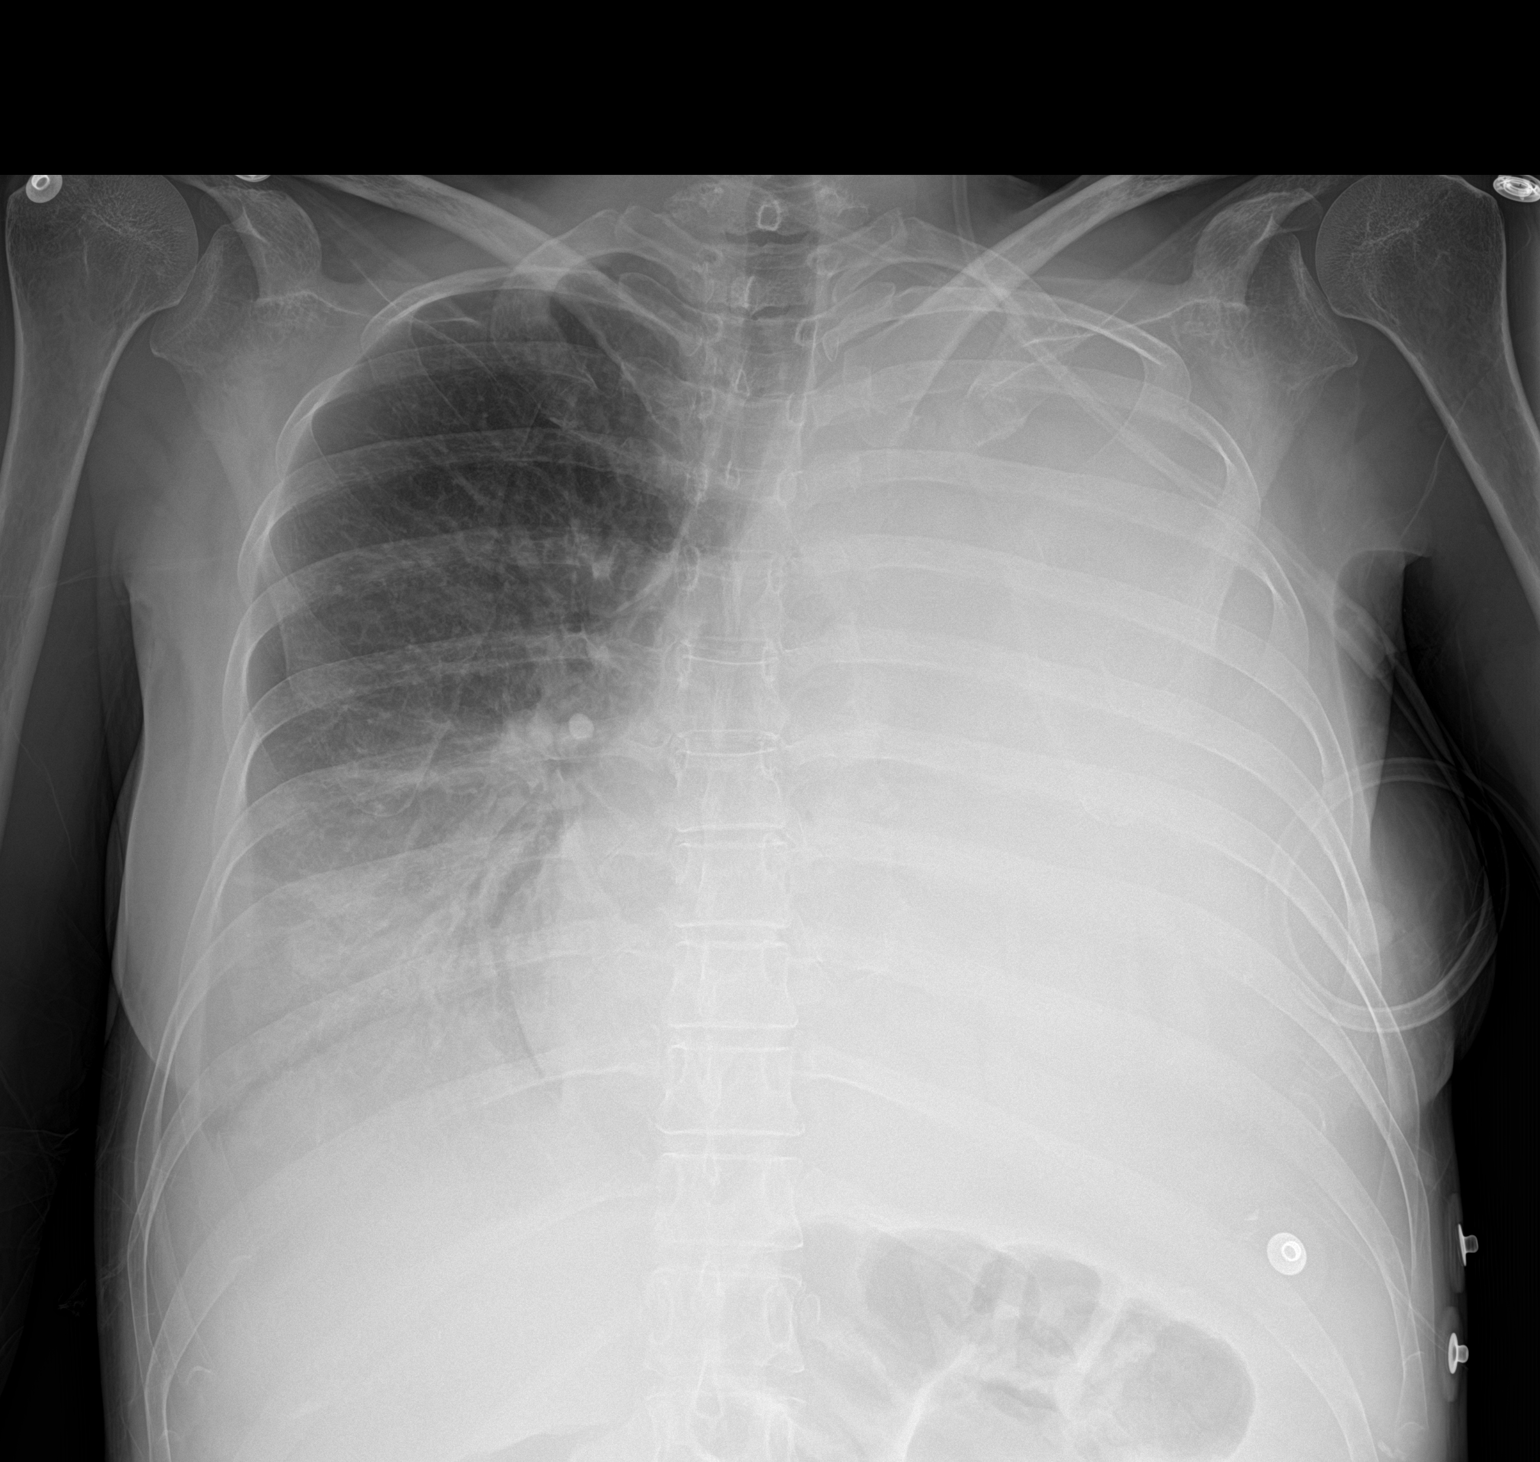

[1 of 1 positions shown; findings below may reference images not displayed]

FINDINGS: Re-accumulation of a large right pleural effusion now with complete
opacification of the left hemithorax. Suspect at least some
associated passive atelectatic changes. Furthermore, new gradient
opacity in the right lung base may reflect some layering pleural
effusion on the contralateral side as well. Much of the cardiac
silhouette is obscured by overlying opacity. Right heart border is
grossly similar to comparison. No visible pneumothorax. No acute
osseous or soft tissue abnormality.
IMPRESSION: Re-accumulation of a large right pleural effusion now with complete
opacification of the left hemithorax.

Gradient opacity in the right lung base may reflect layering right
pleural fluid and/or atelectasis.

ADDENDUM:
Laterality error noted upon further review. Findings and impression
should say:

Re-accumulation of a large LEFT pleural effusion now with complete
opacification of the LEFT hemithorax.

Gradient opacity in the RIGHT lung base may reflect layering RIGHT
pleural fluid or atelectasis.

*** End of Addendum ***
FINDINGS: Re-accumulation of a large right pleural effusion now with complete
opacification of the left hemithorax. Suspect at least some
associated passive atelectatic changes. Furthermore, new gradient
opacity in the right lung base may reflect some layering pleural
effusion on the contralateral side as well. Much of the cardiac
silhouette is obscured by overlying opacity. Right heart border is
grossly similar to comparison. No visible pneumothorax. No acute
osseous or soft tissue abnormality.
IMPRESSION: Re-accumulation of a large right pleural effusion now with complete
opacification of the left hemithorax.

Gradient opacity in the right lung base may reflect layering right
pleural fluid and/or atelectasis.

## 2020-12-06 IMAGING — DX DG CHEST 1V PORT
1 series · 1 of 1 positions shown · non-contrast
Comparison: Portable exam 0161 hours compared to 01/13/2020

CLINICAL DATA: There was of breath, pleural effusion

EXAM:
PORTABLE CHEST 1 VIEW

[chest ap]
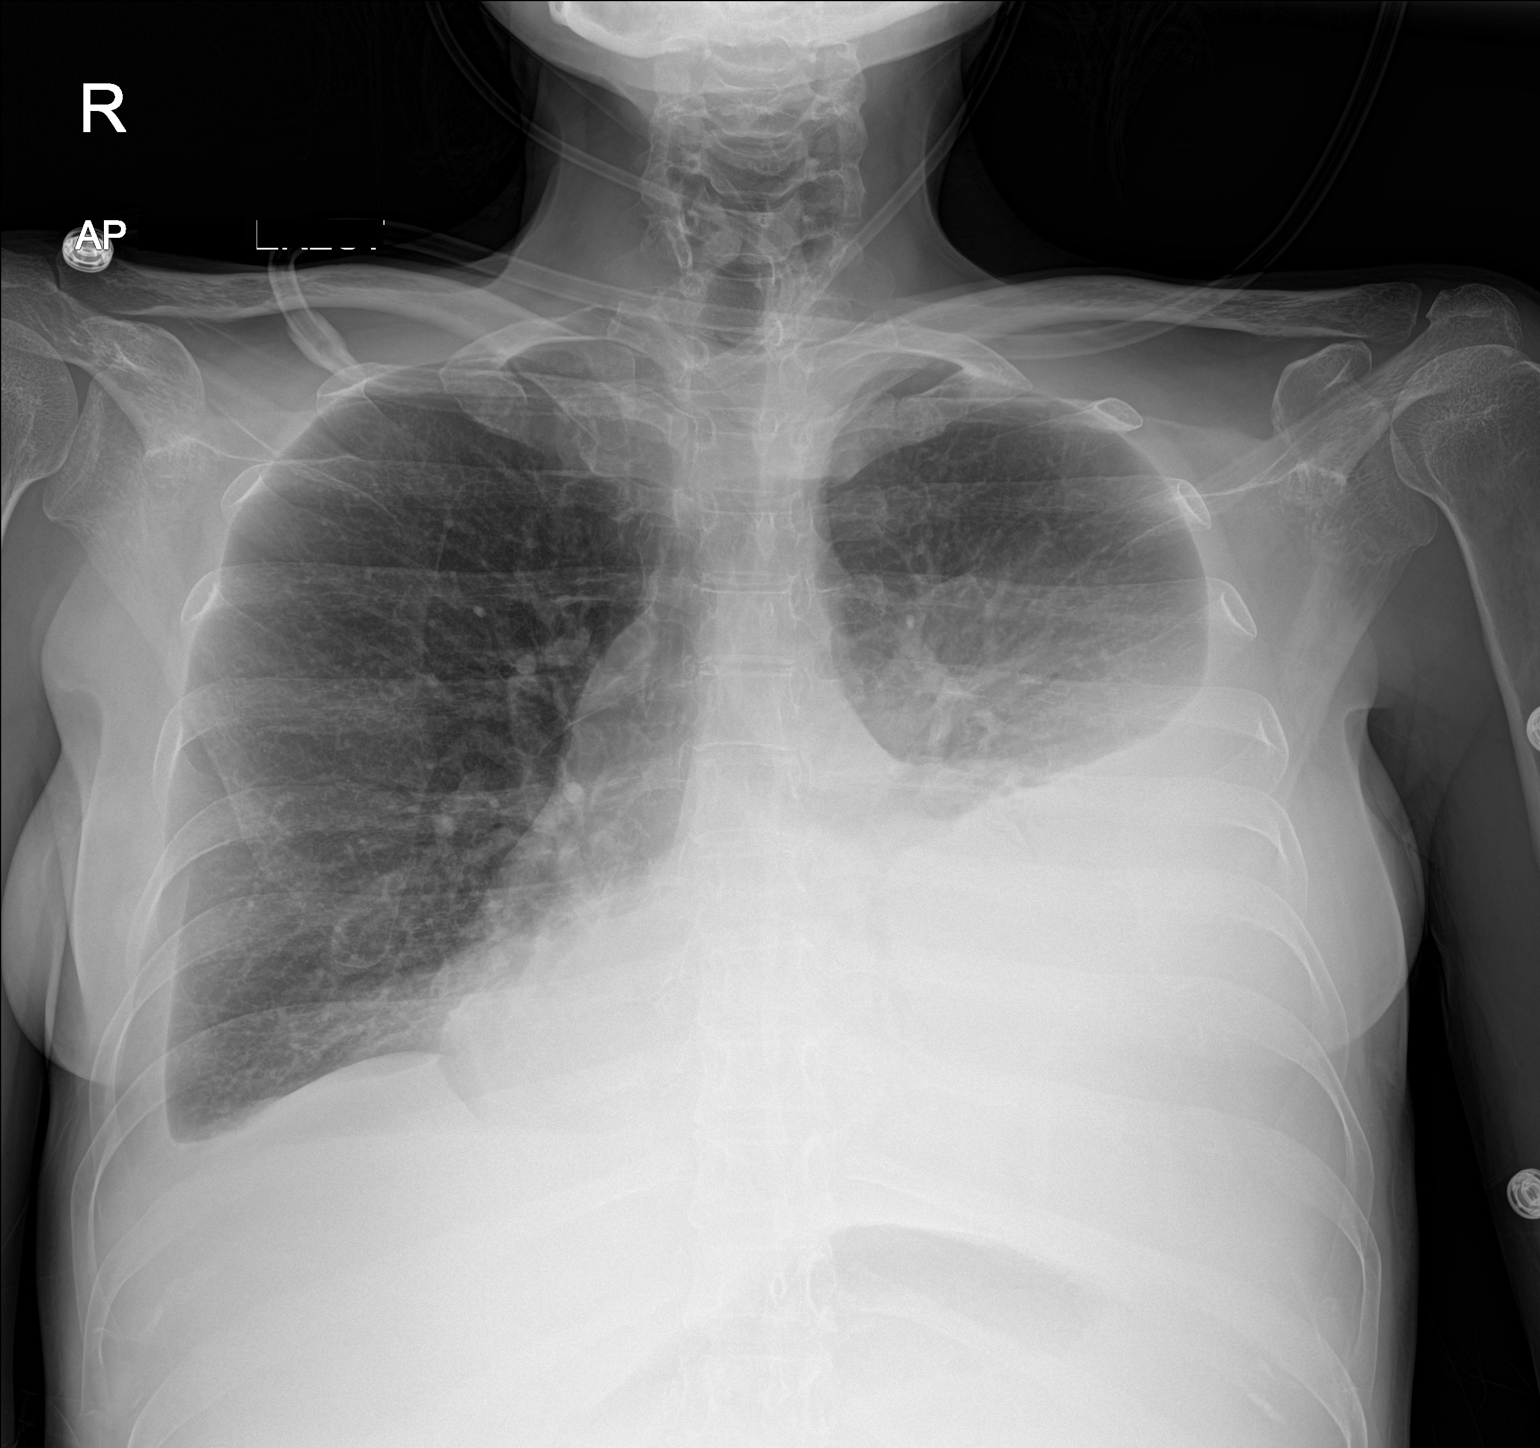

[1 of 1 positions shown; findings below may reference images not displayed]

FINDINGS: Stable heart size and mediastinal contours.

Large LEFT pleural effusion again identified with significant
atelectasis of lower LEFT lung.

Small RIGHT pleural effusion increased since previous exam with
associated mild RIGHT basilar atelectasis.

No definite infiltrate or pneumothorax.

Bones unremarkable.
IMPRESSION: BILATERAL pleural effusions and basilar atelectasis much greater on
LEFT.

Increased RIGHT pleural effusion since prior study.
